# Patient Record
Sex: Female | Born: 1955 | Race: White | Hispanic: No | Marital: Married | Smoking: Never smoker
Health system: Southern US, Community
[De-identification: ages and names within clinical notes are randomized; demographics above are authoritative.]

## PROBLEM LIST (undated history)

## (undated) DIAGNOSIS — I491 Atrial premature depolarization: Secondary | ICD-10-CM

## (undated) DIAGNOSIS — I428 Other cardiomyopathies: Secondary | ICD-10-CM

## (undated) DIAGNOSIS — S52509A Unspecified fracture of the lower end of unspecified radius, initial encounter for closed fracture: Secondary | ICD-10-CM

## (undated) DIAGNOSIS — E559 Vitamin D deficiency, unspecified: Secondary | ICD-10-CM

## (undated) DIAGNOSIS — R9431 Abnormal electrocardiogram [ECG] [EKG]: Secondary | ICD-10-CM

## (undated) DIAGNOSIS — I493 Ventricular premature depolarization: Secondary | ICD-10-CM

## (undated) DIAGNOSIS — M7500 Adhesive capsulitis of unspecified shoulder: Secondary | ICD-10-CM

## (undated) DIAGNOSIS — T8859XA Other complications of anesthesia, initial encounter: Secondary | ICD-10-CM

## (undated) DIAGNOSIS — T4145XA Adverse effect of unspecified anesthetic, initial encounter: Secondary | ICD-10-CM

## (undated) HISTORY — DX: Vitamin D deficiency, unspecified: E55.9

## (undated) HISTORY — DX: Abnormal electrocardiogram (ECG) (EKG): R94.31

## (undated) HISTORY — DX: Other cardiomyopathies: I42.8

## (undated) HISTORY — DX: Ventricular premature depolarization: I49.3

## (undated) HISTORY — DX: Atrial premature depolarization: I49.1

## (undated) HISTORY — PX: COLONOSCOPY: SHX5424

## (undated) HISTORY — PX: TUBAL LIGATION: SHX77

## (undated) HISTORY — DX: Unspecified fracture of the lower end of unspecified radius, initial encounter for closed fracture: S52.509A

---

## 1999-07-14 ENCOUNTER — Other Ambulatory Visit: Admission: RE | Admit: 1999-07-14 | Discharge: 1999-07-14 | Payer: Self-pay | Admitting: *Deleted

## 2000-07-07 ENCOUNTER — Encounter: Admission: RE | Admit: 2000-07-07 | Discharge: 2000-07-07 | Payer: Self-pay | Admitting: *Deleted

## 2000-07-07 ENCOUNTER — Encounter: Payer: Self-pay | Admitting: *Deleted

## 2000-08-03 ENCOUNTER — Other Ambulatory Visit: Admission: RE | Admit: 2000-08-03 | Discharge: 2000-08-03 | Payer: Self-pay | Admitting: *Deleted

## 2001-08-11 ENCOUNTER — Other Ambulatory Visit: Admission: RE | Admit: 2001-08-11 | Discharge: 2001-08-11 | Payer: Self-pay | Admitting: *Deleted

## 2003-06-01 ENCOUNTER — Encounter: Admission: RE | Admit: 2003-06-01 | Discharge: 2003-06-01 | Payer: Self-pay | Admitting: Obstetrics and Gynecology

## 2003-07-16 ENCOUNTER — Other Ambulatory Visit: Admission: RE | Admit: 2003-07-16 | Discharge: 2003-07-16 | Payer: Self-pay | Admitting: Obstetrics and Gynecology

## 2004-07-25 ENCOUNTER — Encounter: Admission: RE | Admit: 2004-07-25 | Discharge: 2004-07-25 | Payer: Self-pay | Admitting: Obstetrics and Gynecology

## 2005-06-12 ENCOUNTER — Ambulatory Visit: Payer: Self-pay | Admitting: Internal Medicine

## 2005-06-23 ENCOUNTER — Ambulatory Visit: Payer: Self-pay | Admitting: Internal Medicine

## 2005-07-16 ENCOUNTER — Ambulatory Visit: Payer: Self-pay | Admitting: Internal Medicine

## 2005-08-24 ENCOUNTER — Ambulatory Visit: Payer: Self-pay | Admitting: Internal Medicine

## 2006-02-02 ENCOUNTER — Encounter: Admission: RE | Admit: 2006-02-02 | Discharge: 2006-02-02 | Payer: Self-pay | Admitting: Obstetrics and Gynecology

## 2007-02-01 ENCOUNTER — Ambulatory Visit: Payer: Self-pay | Admitting: Internal Medicine

## 2007-02-01 LAB — CONVERTED CEMR LAB
ALT: 12 units/L (ref 0–35)
AST: 18 units/L (ref 0–37)
Albumin: 3.8 g/dL (ref 3.5–5.2)
Alkaline Phosphatase: 48 units/L (ref 39–117)
Bilirubin Urine: NEGATIVE
Bilirubin, Direct: 0.2 mg/dL (ref 0.0–0.3)
CO2: 31 meq/L (ref 19–32)
Calcium: 9.2 mg/dL (ref 8.4–10.5)
Chloride: 107 meq/L (ref 96–112)
Eosinophils Absolute: 0.1 10*3/uL (ref 0.0–0.6)
GFR calc non Af Amer: 81 mL/min
Glucose, Bld: 86 mg/dL (ref 70–99)
Glucose, Urine, Semiquant: NEGATIVE
HCT: 35 % — ABNORMAL LOW (ref 36.0–46.0)
HDL: 62.8 mg/dL (ref >39.0)
Hemoglobin: 11.6 g/dL — ABNORMAL LOW (ref 12.0–15.0)
Ketones, urine, test strip: NEGATIVE
LDL Cholesterol: 104 mg/dL — ABNORMAL HIGH (ref 0–99)
MCHC: 33.3 g/dL (ref 30.0–36.0)
Neutrophils Relative %: 54.2 % (ref 43.0–77.0)
Nitrite: NEGATIVE
Platelets: 185 10*3/uL (ref 150–400)
Potassium: 4 meq/L (ref 3.5–5.1)
VLDL: 10 mg/dL (ref 0–40)
WBC Urine, dipstick: NEGATIVE
pH: 7.5

## 2007-02-08 ENCOUNTER — Ambulatory Visit: Payer: Self-pay | Admitting: Internal Medicine

## 2007-02-18 ENCOUNTER — Encounter: Admission: RE | Admit: 2007-02-18 | Discharge: 2007-02-18 | Payer: Self-pay | Admitting: Family Medicine

## 2008-02-20 ENCOUNTER — Encounter: Admission: RE | Admit: 2008-02-20 | Discharge: 2008-02-20 | Payer: Self-pay | Admitting: Family Medicine

## 2008-03-27 ENCOUNTER — Encounter: Admission: RE | Admit: 2008-03-27 | Discharge: 2008-03-27 | Payer: Self-pay | Admitting: Family Medicine

## 2008-10-01 LAB — HM COLONOSCOPY

## 2009-02-26 ENCOUNTER — Encounter: Admission: RE | Admit: 2009-02-26 | Discharge: 2009-02-26 | Payer: Self-pay | Admitting: Family Medicine

## 2010-03-31 ENCOUNTER — Encounter: Admission: RE | Admit: 2010-03-31 | Discharge: 2010-03-31 | Payer: Self-pay | Admitting: Family Medicine

## 2010-03-31 LAB — HM DEXA SCAN: HM Dexa Scan: NORMAL

## 2010-04-29 LAB — HM PAP SMEAR: HM PAP: NEGATIVE

## 2011-04-07 ENCOUNTER — Other Ambulatory Visit: Payer: Self-pay | Admitting: Family Medicine

## 2011-04-07 DIAGNOSIS — Z1231 Encounter for screening mammogram for malignant neoplasm of breast: Secondary | ICD-10-CM

## 2011-05-22 ENCOUNTER — Ambulatory Visit
Admission: RE | Admit: 2011-05-22 | Discharge: 2011-05-22 | Disposition: A | Discharge status: Home / Self Care | Payer: 59 | Source: Ambulatory Visit | Attending: Family Medicine | Admitting: Family Medicine

## 2011-05-22 DIAGNOSIS — Z1231 Encounter for screening mammogram for malignant neoplasm of breast: Secondary | ICD-10-CM

## 2012-02-26 ENCOUNTER — Other Ambulatory Visit: Payer: Self-pay | Admitting: Family Medicine

## 2012-02-26 DIAGNOSIS — Z1231 Encounter for screening mammogram for malignant neoplasm of breast: Secondary | ICD-10-CM

## 2012-05-24 ENCOUNTER — Ambulatory Visit
Admission: RE | Admit: 2012-05-24 | Discharge: 2012-05-24 | Disposition: A | Discharge status: Home / Self Care | Payer: BC Managed Care – PPO | Source: Ambulatory Visit | Attending: Family Medicine | Admitting: Family Medicine

## 2012-05-24 DIAGNOSIS — Z1231 Encounter for screening mammogram for malignant neoplasm of breast: Secondary | ICD-10-CM

## 2013-05-18 DIAGNOSIS — M7500 Adhesive capsulitis of unspecified shoulder: Secondary | ICD-10-CM

## 2013-05-18 HISTORY — DX: Adhesive capsulitis of unspecified shoulder: M75.00

## 2013-05-31 ENCOUNTER — Other Ambulatory Visit: Payer: Self-pay

## 2013-05-31 DIAGNOSIS — Z1231 Encounter for screening mammogram for malignant neoplasm of breast: Secondary | ICD-10-CM

## 2013-09-04 ENCOUNTER — Ambulatory Visit: Payer: BC Managed Care – PPO

## 2013-09-06 ENCOUNTER — Ambulatory Visit
Admission: RE | Admit: 2013-09-06 | Discharge: 2013-09-06 | Disposition: A | Discharge status: Home / Self Care | Payer: BC Managed Care – PPO | Source: Ambulatory Visit

## 2013-09-06 DIAGNOSIS — Z1231 Encounter for screening mammogram for malignant neoplasm of breast: Secondary | ICD-10-CM

## 2013-09-21 ENCOUNTER — Ambulatory Visit (INDEPENDENT_AMBULATORY_CARE_PROVIDER_SITE_OTHER): Payer: BC Managed Care – PPO | Admitting: Cardiology

## 2013-09-21 ENCOUNTER — Encounter: Payer: Self-pay | Admitting: Cardiology

## 2013-09-21 ENCOUNTER — Encounter: Payer: Self-pay | Admitting: General Surgery

## 2013-09-21 VITALS — BP 114/63 | HR 63 | Ht 62.0 in | Wt 135.0 lb

## 2013-09-21 DIAGNOSIS — I4510 Unspecified right bundle-branch block: Secondary | ICD-10-CM

## 2013-09-21 DIAGNOSIS — E559 Vitamin D deficiency, unspecified: Secondary | ICD-10-CM

## 2013-09-21 DIAGNOSIS — R9431 Abnormal electrocardiogram [ECG] [EKG]: Secondary | ICD-10-CM

## 2013-09-21 DIAGNOSIS — R079 Chest pain, unspecified: Secondary | ICD-10-CM

## 2013-09-21 DIAGNOSIS — R011 Cardiac murmur, unspecified: Secondary | ICD-10-CM

## 2013-09-21 NOTE — Progress Notes (Signed)
  [address] [place], [place]  [address] Phone: [phone] Fax:  [phone]  Date:  09/21/2013   ID:  Emma Kim, DOB 01/10/1956, MRN [id number]  PCP:  Herb Grays, MD  Cardiologist:  Armanda Magic, MD     History of Present Illness: Emma Kim is a 58 y.o. female with a no history of CAD who presents today for evaluation of chest pain and new heart murmur.  She says that since December she has had some chest discomfort especially with driving that would radiate down her arm.  She is an Airline pilot and subsequently was diagnosed with a frozen shoulder and is followed by Dr. Rennis Chris and since then her symptoms have improved.  She says that she has not noticed the CP recently and also has some anxiety and was not sure that it wasn't all anxiety.  She denies any DOE, SOB, palpitations, LE edema, dizziness or syncope.   Wt Readings from Last 3 Encounters:  09/21/13 135 lb (61.236 kg)  02/08/07 130 lb (58.968 kg)     Past Medical History  Diagnosis Date  . Vitamin D deficiency   . Heart murmur, systolic   . Abnormal EKG     Current Outpatient Prescriptions  Medication Sig Dispense Refill  . Calcium Carbonate-Vitamin D (CALCIUM + D PO) Take by mouth. 1 tab every couple days       No current facility-administered medications for this visit.    Allergies:   No Known Allergies  Social History:  The patient  reports that she has never smoked. She does not have any smokeless tobacco history on file. She reports that she drinks alcohol. She reports that she does not use illicit drugs.   Family History:  The patient's family history is not on file.   ROS:  Please see the history of present illness.      All other systems reviewed and negative.   PHYSICAL EXAM: VS:  BP 114/63  Pulse 63  Ht 5\' 2"  (1.575 m)  Wt 135 lb (61.236 kg)  BMI 24.69 kg/m2 Well nourished, well developed, in no acute distress HEENT: normal Neck: no JVD Cardiac:  normal S1, S2; RRR; 1/6  SM at RUSB Lungs:  clear to auscultation bilaterally, no wheezing, rhonchi or rales Abd: soft, nontender, no hepatomegaly Ext: no edema Skin: warm and dry Neuro:  CNs 2-12 intact, no focal abnormalities noted  EKG:  NSR with IRBBB with T wave inversions in V1 and V2     ASSESSMENT AND PLAN:  1. Atypical left arm and chest pain that now has resolved after treatment of a "frozen shoulder" with PT.  Her symptoms dont sound cardiac and have since resolved with physical therapy.  No further cardiac workup at this time unless her symptoms reoccur.  2. Heart murmur - 2D echo to assess 3.  IRBBB  Followup with me PRN  Signed, Armanda Magic, MD 09/21/2013 1:33 PM

## 2013-09-21 NOTE — Patient Instructions (Signed)
Your physician recommends that you continue on your current medications as directed. Please refer to the Current Medication list given to you today.  Your physician has requested that you have an echocardiogram. Echocardiography is a painless test that uses sound waves to create images of your heart. It provides your doctor with information about the size and shape of your heart and how well your heart's chambers and valves are working. This procedure takes approximately one hour. There are no restrictions for this procedure.  Your physician recommends that you schedule a follow-up appointment As Needed

## 2013-10-04 ENCOUNTER — Ambulatory Visit (HOSPITAL_COMMUNITY): Payer: BC Managed Care – PPO | Attending: Cardiology | Admitting: Radiology

## 2013-10-04 DIAGNOSIS — R011 Cardiac murmur, unspecified: Secondary | ICD-10-CM

## 2013-10-04 NOTE — Progress Notes (Signed)
Echocardiogram performed.  

## 2013-10-06 ENCOUNTER — Other Ambulatory Visit: Payer: Self-pay | Admitting: *Deleted

## 2013-10-06 DIAGNOSIS — R079 Chest pain, unspecified: Secondary | ICD-10-CM

## 2013-10-10 ENCOUNTER — Other Ambulatory Visit (INDEPENDENT_AMBULATORY_CARE_PROVIDER_SITE_OTHER): Payer: BC Managed Care – PPO

## 2013-10-10 DIAGNOSIS — R079 Chest pain, unspecified: Secondary | ICD-10-CM

## 2013-10-10 LAB — BASIC METABOLIC PANEL
BUN: 15 mg/dL (ref 6–23)
CALCIUM: 9.4 mg/dL (ref 8.4–10.5)
CO2: 30 meq/L (ref 19–32)
CREATININE: 0.8 mg/dL (ref 0.4–1.2)
Chloride: 105 mEq/L (ref 96–112)
GFR: 75.45 mL/min (ref >60.00)
Glucose, Bld: 79 mg/dL (ref 70–99)
Potassium: 3.4 mEq/L — ABNORMAL LOW (ref 3.5–5.1)
SODIUM: 142 meq/L (ref 135–145)

## 2013-10-11 ENCOUNTER — Encounter: Payer: Self-pay | Admitting: Cardiology

## 2013-10-23 ENCOUNTER — Ambulatory Visit (HOSPITAL_COMMUNITY)
Admission: RE | Admit: 2013-10-23 | Discharge: 2013-10-23 | Disposition: A | Discharge status: Home / Self Care | Payer: BC Managed Care – PPO | Source: Ambulatory Visit | Attending: Cardiology | Admitting: Cardiology

## 2013-10-23 DIAGNOSIS — R079 Chest pain, unspecified: Secondary | ICD-10-CM | POA: Insufficient documentation

## 2013-10-23 MED ORDER — GADOBENATE DIMEGLUMINE 529 MG/ML IV SOLN
20.0000 mL | Freq: Once | INTRAVENOUS | Status: AC
  Administered 2013-10-23: 20 mL via INTRAVENOUS

## 2013-10-26 ENCOUNTER — Telehealth: Payer: Self-pay | Admitting: Cardiology

## 2013-10-26 DIAGNOSIS — I428 Other cardiomyopathies: Secondary | ICD-10-CM

## 2013-10-26 NOTE — Telephone Encounter (Signed)
1 yr f/u echo scheduled.

## 2014-06-12 ENCOUNTER — Encounter (HOSPITAL_COMMUNITY): Payer: Self-pay | Admitting: *Deleted

## 2014-06-12 NOTE — Progress Notes (Signed)
Emma Kim had a cardiac work up last year due to a heart murmer and chest pain.  Chest pain had subsided after treated for frozen shoulder.  Emma Kim denies any further chest pain.

## 2014-06-13 ENCOUNTER — Ambulatory Visit (HOSPITAL_COMMUNITY)
Admission: RE | Admit: 2014-06-13 | Discharge: 2014-06-14 | Disposition: A | Discharge status: Home / Self Care | Payer: BLUE CROSS/BLUE SHIELD | Source: Ambulatory Visit | Attending: Orthopedic Surgery | Admitting: Orthopedic Surgery

## 2014-06-13 ENCOUNTER — Encounter (HOSPITAL_COMMUNITY): Payer: Self-pay | Admitting: Certified Registered Nurse Anesthetist

## 2014-06-13 ENCOUNTER — Ambulatory Visit (HOSPITAL_COMMUNITY): Payer: BLUE CROSS/BLUE SHIELD | Admitting: Certified Registered Nurse Anesthetist

## 2014-06-13 ENCOUNTER — Encounter (HOSPITAL_COMMUNITY)
Admission: RE | Disposition: A | Discharge status: Home / Self Care | Payer: Self-pay | Source: Ambulatory Visit | Attending: Orthopedic Surgery

## 2014-06-13 DIAGNOSIS — W19XXXA Unspecified fall, initial encounter: Secondary | ICD-10-CM | POA: Insufficient documentation

## 2014-06-13 DIAGNOSIS — Y929 Unspecified place or not applicable: Secondary | ICD-10-CM | POA: Insufficient documentation

## 2014-06-13 DIAGNOSIS — S52501D Unspecified fracture of the lower end of right radius, subsequent encounter for closed fracture with routine healing: Secondary | ICD-10-CM

## 2014-06-13 DIAGNOSIS — S52509A Unspecified fracture of the lower end of unspecified radius, initial encounter for closed fracture: Secondary | ICD-10-CM

## 2014-06-13 DIAGNOSIS — S52501A Unspecified fracture of the lower end of right radius, initial encounter for closed fracture: Secondary | ICD-10-CM | POA: Insufficient documentation

## 2014-06-13 DIAGNOSIS — E559 Vitamin D deficiency, unspecified: Secondary | ICD-10-CM | POA: Insufficient documentation

## 2014-06-13 HISTORY — DX: Unspecified fracture of the lower end of unspecified radius, initial encounter for closed fracture: S52.509A

## 2014-06-13 HISTORY — DX: Adhesive capsulitis of unspecified shoulder: M75.00

## 2014-06-13 HISTORY — PX: OPEN REDUCTION INTERNAL FIXATION (ORIF) DISTAL RADIAL FRACTURE: SHX5989

## 2014-06-13 HISTORY — DX: Adverse effect of unspecified anesthetic, initial encounter: T41.45XA

## 2014-06-13 HISTORY — DX: Other complications of anesthesia, initial encounter: T88.59XA

## 2014-06-13 LAB — BASIC METABOLIC PANEL
ANION GAP: 7 (ref 5–15)
BUN: 12 mg/dL (ref 6–23)
CALCIUM: 9.5 mg/dL (ref 8.4–10.5)
CO2: 29 mmol/L (ref 19–32)
Chloride: 105 mmol/L (ref 96–112)
Creatinine, Ser: 0.84 mg/dL (ref 0.50–1.10)
GFR calc Af Amer: 88 mL/min — ABNORMAL LOW (ref >90)
GFR calc non Af Amer: 76 mL/min — ABNORMAL LOW (ref >90)
Glucose, Bld: 104 mg/dL — ABNORMAL HIGH (ref 70–99)
Potassium: 3.5 mmol/L (ref 3.5–5.1)
SODIUM: 141 mmol/L (ref 135–145)

## 2014-06-13 LAB — CBC
HCT: 38.7 % (ref 36.0–46.0)
HEMOGLOBIN: 12.8 g/dL (ref 12.0–15.0)
MCH: 28.4 pg (ref 26.0–34.0)
MCHC: 33.1 g/dL (ref 30.0–36.0)
MCV: 85.8 fL (ref 78.0–100.0)
Platelets: 192 10*3/uL (ref 150–400)
RBC: 4.51 MIL/uL (ref 3.87–5.11)
RDW: 12.6 % (ref 11.5–15.5)
WBC: 6.3 10*3/uL (ref 4.0–10.5)

## 2014-06-13 SURGERY — OPEN REDUCTION INTERNAL FIXATION (ORIF) DISTAL RADIUS FRACTURE
Anesthesia: Regional | Site: Arm Lower | Laterality: Right

## 2014-06-13 MED ORDER — MORPHINE SULFATE 2 MG/ML IJ SOLN: 1.0000 mg | INTRAMUSCULAR | Status: DC | PRN

## 2014-06-13 MED ORDER — CEFAZOLIN SODIUM-DEXTROSE 2-3 GM-% IV SOLR
INTRAVENOUS | Status: AC
  Filled 2014-06-13: qty 50

## 2014-06-13 MED ORDER — EPHEDRINE SULFATE 50 MG/ML IJ SOLN
INTRAMUSCULAR | Status: DC | PRN
  Administered 2014-06-13: 10 mg via INTRAVENOUS
  Administered 2014-06-13: 5 mg via INTRAVENOUS

## 2014-06-13 MED ORDER — ONDANSETRON HCL 4 MG PO TABS: 4.0000 mg | ORAL_TABLET | Freq: Four times a day (QID) | ORAL | Status: DC | PRN

## 2014-06-13 MED ORDER — CEFAZOLIN SODIUM 1-5 GM-% IV SOLN
1.0000 g | Freq: Three times a day (TID) | INTRAVENOUS | Status: DC
  Administered 2014-06-14: 1 g via INTRAVENOUS
  Filled 2014-06-13 (×5): qty 50

## 2014-06-13 MED ORDER — DEXAMETHASONE SODIUM PHOSPHATE 4 MG/ML IJ SOLN
INTRAMUSCULAR | Status: DC | PRN
  Administered 2014-06-13: 4 mg via INTRAVENOUS

## 2014-06-13 MED ORDER — PROPOFOL 10 MG/ML IV BOLUS
INTRAVENOUS | Status: DC | PRN
  Administered 2014-06-13: 130 mg via INTRAVENOUS

## 2014-06-13 MED ORDER — DOCUSATE SODIUM 100 MG PO CAPS
100.0000 mg | ORAL_CAPSULE | Freq: Two times a day (BID) | ORAL | Status: DC
  Administered 2014-06-13 – 2014-06-14 (×2): 100 mg via ORAL
  Filled 2014-06-13 (×3): qty 1

## 2014-06-13 MED ORDER — LACTATED RINGERS IV SOLN
INTRAVENOUS | Status: DC | PRN
  Administered 2014-06-13 (×2): via INTRAVENOUS

## 2014-06-13 MED ORDER — HYDROMORPHONE HCL 1 MG/ML IJ SOLN: 0.2500 mg | INTRAMUSCULAR | Status: DC | PRN

## 2014-06-13 MED ORDER — ONDANSETRON HCL 4 MG/2ML IJ SOLN: 4.0000 mg | Freq: Four times a day (QID) | INTRAMUSCULAR | Status: DC | PRN

## 2014-06-13 MED ORDER — CHLORHEXIDINE GLUCONATE 4 % EX LIQD
60.0000 mL | Freq: Once | CUTANEOUS | Status: DC
  Filled 2014-06-13: qty 60

## 2014-06-13 MED ORDER — OXYCODONE-ACETAMINOPHEN 5-325 MG PO TABS: 1.0000 | ORAL_TABLET | ORAL | Status: DC | PRN

## 2014-06-13 MED ORDER — DOCUSATE SODIUM 100 MG PO CAPS: 100.0000 mg | ORAL_CAPSULE | Freq: Two times a day (BID) | ORAL | Status: DC

## 2014-06-13 MED ORDER — HYDROCODONE-ACETAMINOPHEN 5-325 MG PO TABS: 1.0000 | ORAL_TABLET | ORAL | Status: DC | PRN

## 2014-06-13 MED ORDER — CEFAZOLIN SODIUM 1-5 GM-% IV SOLN
1.0000 g | INTRAVENOUS | Status: AC
  Administered 2014-06-13: 1 g via INTRAVENOUS
  Filled 2014-06-13: qty 50

## 2014-06-13 MED ORDER — DIPHENHYDRAMINE HCL 25 MG PO CAPS: 25.0000 mg | ORAL_CAPSULE | Freq: Four times a day (QID) | ORAL | Status: DC | PRN

## 2014-06-13 MED ORDER — METHOCARBAMOL 1000 MG/10ML IJ SOLN
500.0000 mg | Freq: Four times a day (QID) | INTRAMUSCULAR | Status: DC | PRN
  Filled 2014-06-13: qty 5

## 2014-06-13 MED ORDER — LACTATED RINGERS IV SOLN
INTRAVENOUS | Status: DC
  Administered 2014-06-13: 50 mL/h via INTRAVENOUS

## 2014-06-13 MED ORDER — VITAMIN C 500 MG PO TABS
500.0000 mg | ORAL_TABLET | Freq: Every day | ORAL | Status: DC
Start: 2014-06-13 — End: 2014-11-26

## 2014-06-13 MED ORDER — FENTANYL CITRATE 0.05 MG/ML IJ SOLN
INTRAMUSCULAR | Status: AC
  Filled 2014-06-13: qty 2

## 2014-06-13 MED ORDER — OXYCODONE HCL 5 MG PO TABS: 5.0000 mg | ORAL_TABLET | Freq: Once | ORAL | Status: DC | PRN

## 2014-06-13 MED ORDER — CEFAZOLIN SODIUM-DEXTROSE 2-3 GM-% IV SOLR
2.0000 g | INTRAVENOUS | Status: AC
  Administered 2014-06-13: 2 g via INTRAVENOUS

## 2014-06-13 MED ORDER — FENTANYL CITRATE 0.05 MG/ML IJ SOLN
INTRAMUSCULAR | Status: DC | PRN
  Administered 2014-06-13: 50 ug via INTRAVENOUS

## 2014-06-13 MED ORDER — OXYCODONE HCL 5 MG/5ML PO SOLN: 5.0000 mg | Freq: Once | ORAL | Status: DC | PRN

## 2014-06-13 MED ORDER — METHOCARBAMOL 500 MG PO TABS
500.0000 mg | ORAL_TABLET | Freq: Four times a day (QID) | ORAL | Status: DC | PRN
  Filled 2014-06-13: qty 1

## 2014-06-13 MED ORDER — PROPOFOL 10 MG/ML IV BOLUS
INTRAVENOUS | Status: AC
  Filled 2014-06-13: qty 20

## 2014-06-13 MED ORDER — MIDAZOLAM HCL 2 MG/2ML IJ SOLN
INTRAMUSCULAR | Status: AC
  Filled 2014-06-13: qty 2

## 2014-06-13 MED ORDER — BUPIVACAINE HCL (PF) 0.25 % IJ SOLN
INTRAMUSCULAR | Status: AC
  Filled 2014-06-13: qty 30

## 2014-06-13 MED ORDER — ONDANSETRON HCL 4 MG/2ML IJ SOLN
INTRAMUSCULAR | Status: DC | PRN
  Administered 2014-06-13: 4 mg via INTRAVENOUS

## 2014-06-13 MED ORDER — METHOCARBAMOL 500 MG PO TABS: 500.0000 mg | ORAL_TABLET | Freq: Four times a day (QID) | ORAL | Status: DC

## 2014-06-13 MED ORDER — PROMETHAZINE HCL 25 MG/ML IJ SOLN: 6.2500 mg | INTRAMUSCULAR | Status: DC | PRN

## 2014-06-13 MED ORDER — KETOROLAC TROMETHAMINE 30 MG/ML IJ SOLN: 30.0000 mg | Freq: Once | INTRAMUSCULAR | Status: DC | PRN

## 2014-06-13 MED ORDER — ONDANSETRON HCL 4 MG/2ML IJ SOLN
INTRAMUSCULAR | Status: AC
  Filled 2014-06-13: qty 2

## 2014-06-13 MED ORDER — FENTANYL CITRATE 0.05 MG/ML IJ SOLN
INTRAMUSCULAR | Status: AC
  Filled 2014-06-13: qty 5

## 2014-06-13 MED ORDER — KCL IN DEXTROSE-NACL 20-5-0.45 MEQ/L-%-% IV SOLN
INTRAVENOUS | Status: DC
  Administered 2014-06-13: 50 mL/h via INTRAVENOUS
  Filled 2014-06-13 (×2): qty 1000

## 2014-06-13 MED ORDER — VITAMIN C 500 MG PO TABS
1000.0000 mg | ORAL_TABLET | Freq: Every day | ORAL | Status: DC
  Administered 2014-06-13 – 2014-06-14 (×2): 1000 mg via ORAL
  Filled 2014-06-13 (×2): qty 2

## 2014-06-13 MED ORDER — BUPIVACAINE-EPINEPHRINE (PF) 0.5% -1:200000 IJ SOLN
INTRAMUSCULAR | Status: DC | PRN
  Administered 2014-06-13: 30 mL via PERINEURAL

## 2014-06-13 MED ORDER — ARTIFICIAL TEARS OP OINT
TOPICAL_OINTMENT | OPHTHALMIC | Status: AC
  Filled 2014-06-13: qty 3.5

## 2014-06-13 MED ORDER — 0.9 % SODIUM CHLORIDE (POUR BTL) OPTIME
TOPICAL | Status: DC | PRN
  Administered 2014-06-13: 1000 mL

## 2014-06-13 SURGICAL SUPPLY — 65 items
BANDAGE ELASTIC 3 VELCRO ST LF (GAUZE/BANDAGES/DRESSINGS) ×2 IMPLANT
BANDAGE ELASTIC 4 VELCRO ST LF (GAUZE/BANDAGES/DRESSINGS) ×2 IMPLANT
BIT DRILL 2.2 SS TIBIAL (BIT) ×2 IMPLANT
BLADE SURG ROTATE 9660 (MISCELLANEOUS) IMPLANT
BNDG ESMARK 4X9 LF (GAUZE/BANDAGES/DRESSINGS) ×2 IMPLANT
BNDG GAUZE ELAST 4 BULKY (GAUZE/BANDAGES/DRESSINGS) ×2 IMPLANT
CANISTER SUCTION 2500CC (MISCELLANEOUS) ×2 IMPLANT
CORDS BIPOLAR (ELECTRODE) ×2 IMPLANT
COVER SURGICAL LIGHT HANDLE (MISCELLANEOUS) ×2 IMPLANT
CUFF TOURNIQUET SINGLE 18IN (TOURNIQUET CUFF) ×2 IMPLANT
CUFF TOURNIQUET SINGLE 24IN (TOURNIQUET CUFF) IMPLANT
DRAIN TLS ROUND 10FR (DRAIN) IMPLANT
DRAPE OEC MINIVIEW 54X84 (DRAPES) ×2 IMPLANT
DRAPE SURG 17X11 SM STRL (DRAPES) ×2 IMPLANT
DRSG ADAPTIC 3X8 NADH LF (GAUZE/BANDAGES/DRESSINGS) ×2 IMPLANT
ELECT REM PT RETURN 9FT ADLT (ELECTROSURGICAL)
ELECTRODE REM PT RTRN 9FT ADLT (ELECTROSURGICAL) IMPLANT
GAUZE SPONGE 4X4 12PLY STRL (GAUZE/BANDAGES/DRESSINGS) ×2 IMPLANT
GAUZE SPONGE 4X4 16PLY XRAY LF (GAUZE/BANDAGES/DRESSINGS) IMPLANT
GLOVE BIOGEL PI IND STRL 7.0 (GLOVE) ×2 IMPLANT
GLOVE BIOGEL PI IND STRL 8.5 (GLOVE) ×1 IMPLANT
GLOVE BIOGEL PI INDICATOR 7.0 (GLOVE) ×2
GLOVE BIOGEL PI INDICATOR 8.5 (GLOVE) ×1
GLOVE ECLIPSE 6.5 STRL STRAW (GLOVE) ×2 IMPLANT
GLOVE SURG ORTHO 8.0 STRL STRW (GLOVE) ×2 IMPLANT
GOWN STRL REUS W/ TWL LRG LVL3 (GOWN DISPOSABLE) ×2 IMPLANT
GOWN STRL REUS W/ TWL XL LVL3 (GOWN DISPOSABLE) ×1 IMPLANT
GOWN STRL REUS W/TWL LRG LVL3 (GOWN DISPOSABLE) ×2
GOWN STRL REUS W/TWL XL LVL3 (GOWN DISPOSABLE) ×1
K-WIRE 1.6 (WIRE) ×1
K-WIRE FX5X1.6XNS BN SS (WIRE) ×1
KIT BASIN OR (CUSTOM PROCEDURE TRAY) ×2 IMPLANT
KIT ROOM TURNOVER OR (KITS) ×2 IMPLANT
KWIRE FX5X1.6XNS BN SS (WIRE) ×1 IMPLANT
MANIFOLD NEPTUNE II (INSTRUMENTS) ×2 IMPLANT
NEEDLE HYPO 25X1 1.5 SAFETY (NEEDLE) IMPLANT
NS IRRIG 1000ML POUR BTL (IV SOLUTION) ×2 IMPLANT
PACK ORTHO EXTREMITY (CUSTOM PROCEDURE TRAY) ×2 IMPLANT
PAD ARMBOARD 7.5X6 YLW CONV (MISCELLANEOUS) ×4 IMPLANT
PAD CAST 4YDX4 CTTN HI CHSV (CAST SUPPLIES) ×1 IMPLANT
PADDING CAST COTTON 4X4 STRL (CAST SUPPLIES) ×1
PEG LOCKING SMOOTH 2.2X18 (Peg) ×6 IMPLANT
PEG LOCKING SMOOTH 2.2X20 (Screw) ×2 IMPLANT
PLATE NARROW DVR RIGHT (Plate) ×2 IMPLANT
SCREW LOCK 12X2.7X 3 LD (Screw) ×1 IMPLANT
SCREW LOCK 14X2.7X 3 LD TPR (Screw) ×4 IMPLANT
SCREW LOCK 18X2.7X 3 LD TPR (Screw) ×1 IMPLANT
SCREW LOCKING 2.7X12MM (Screw) ×1 IMPLANT
SCREW LOCKING 2.7X14 (Screw) ×4 IMPLANT
SCREW LOCKING 2.7X18 (Screw) ×1 IMPLANT
SCREW MULTI DIRECTIONAL 2.7X18 (Screw) ×2 IMPLANT
SOAP 2 % CHG 4 OZ (WOUND CARE) ×2 IMPLANT
SPONGE LAP 4X18 X RAY DECT (DISPOSABLE) IMPLANT
STRIP CLOSURE SKIN 1/2X4 (GAUZE/BANDAGES/DRESSINGS) IMPLANT
SUT ETHILON 4 0 PS 2 18 (SUTURE) IMPLANT
SUT MNCRL AB 4-0 PS2 18 (SUTURE) IMPLANT
SUT PROLENE 4 0 PS 2 18 (SUTURE) ×4 IMPLANT
SUT VIC AB 2-0 FS1 27 (SUTURE) ×2 IMPLANT
SUT VICRYL 4-0 PS2 18IN ABS (SUTURE) ×2 IMPLANT
SYR CONTROL 10ML LL (SYRINGE) IMPLANT
SYSTEM CHEST DRAIN TLS 7FR (DRAIN) IMPLANT
TOWEL OR 17X24 6PK STRL BLUE (TOWEL DISPOSABLE) ×2 IMPLANT
TOWEL OR 17X26 10 PK STRL BLUE (TOWEL DISPOSABLE) ×2 IMPLANT
TUBE CONNECTING 12X1/4 (SUCTIONS) ×2 IMPLANT
YANKAUER SUCT BULB TIP NO VENT (SUCTIONS) ×2 IMPLANT

## 2014-06-13 NOTE — Anesthesia Postprocedure Evaluation (Signed)
Anesthesia Post Note  Patient: Emma Kim  Procedure(s) Performed: Procedure(s) (LRB): OPEN REDUCTION INTERNAL FIXATION (ORIF) RIGHT DISTAL RADIAL FRACTURE (Right)  Anesthesia type: general  Patient location: PACU  Post pain: Pain level controlled  Post assessment: Patient's Cardiovascular Status Stable  Last Vitals:  Filed Vitals:   06/13/14 2021  BP: 132/79  Pulse: 66  Temp: 36.6 C  Resp:     Post vital signs: Reviewed and stable  Level of consciousness: sedated  Complications: No apparent anesthesia complications

## 2014-06-13 NOTE — Transfer of Care (Signed)
Immediate Anesthesia Transfer of Care Note  Patient: Emma Kim  Procedure(s) Performed: Procedure(s): OPEN REDUCTION INTERNAL FIXATION (ORIF) RIGHT DISTAL RADIAL FRACTURE (Right)  Patient Location: PACU  Anesthesia Type:General and Regional  Level of Consciousness: awake, alert , oriented and patient cooperative  Airway & Oxygen Therapy: Patient Spontanous Breathing and Patient connected to nasal cannula oxygen  Post-op Assessment: Report given to PACU RN, Post -op Vital signs reviewed and stable and Patient moving all extremities  Post vital signs: Reviewed and stable  Last Vitals:  Filed Vitals:   06/13/14 1443  BP: 170/97  Pulse: 64  Temp: 37.2 C  Resp: 16    Complications: No apparent anesthesia complications

## 2014-06-13 NOTE — Progress Notes (Signed)
Orthopedic Tech Progress Note Patient Details:  Emma Kim 11-06-1955 [id number] Delivered arm sling to pt.'s nurse. Ortho Devices Type of Ortho Device: Arm sling Ortho Device/Splint Location: RUE Ortho Device/Splint Interventions: Other (comment)   [name] 06/13/2014, 7:36 PM

## 2014-06-13 NOTE — Anesthesia Preprocedure Evaluation (Addendum)
Anesthesia Evaluation  Patient identified by MRN, date of birth, ID band Patient awake    Reviewed: Allergy & Precautions, NPO status , Patient's Chart, lab work & pertinent test results  History of Anesthesia Complications Negative for: history of anesthetic complications  Airway Mallampati: II  TM Distance: >3 FB Neck ROM: Full    Dental no notable dental hx. (+) Dental Advisory Given   Pulmonary neg pulmonary ROS,  breath sounds clear to auscultation  Pulmonary exam normal       Cardiovascular + Valvular Problems/Murmurs Rhythm:Regular Rate:Normal     Neuro/Psych PSYCHIATRIC DISORDERS Anxiety negative neurological ROS     GI/Hepatic negative GI ROS, Neg liver ROS,   Endo/Other  negative endocrine ROS  Renal/GU negative Renal ROS  negative genitourinary   Musculoskeletal  (+) Arthritis -, Osteoarthritis,    Abdominal   Peds negative pediatric ROS (+)  Hematology negative hematology ROS (+)   Anesthesia Other Findings   Reproductive/Obstetrics negative OB ROS                             Anesthesia Physical Anesthesia Plan  ASA: II  Anesthesia Plan: General and Regional   Post-op Pain Management: MAC Combined w/ Regional for Post-op pain   Induction: Intravenous  Airway Management Planned: LMA  Additional Equipment:   Intra-op Plan:   Post-operative Plan: Extubation in OR  Informed Consent: I have reviewed the patients History and Physical, chart, labs and discussed the procedure including the risks, benefits and alternatives for the proposed anesthesia with the patient or authorized representative who has indicated his/her understanding and acceptance.   Dental advisory given  Plan Discussed with: CRNA  Anesthesia Plan Comments:        Anesthesia Quick Evaluation

## 2014-06-13 NOTE — H&P (Signed)
Emma Kim is an 59 y.o. female.   Chief Complaint: Right wrist injury  HPI: Pt fell and landed and injured right wrist. Pt with deformity and pain. No prior injury to right wrist. Pt here for surgery.  Past Medical History  Diagnosis Date  . Vitamin D deficiency   . Heart murmur, systolic   . Abnormal EKG   . Complication of anesthesia     when she woke up, "felt like she was coming through a fog"  . Frozen shoulder 2015    left    Past Surgical History  Procedure Laterality Date  . Tubal ligation    . Colonoscopy      Family History  Problem Relation Age of Onset  . Hypertension Mother   . Hyperlipidemia Mother   . Hyperlipidemia Father    Social History:  reports that she has never smoked. She does not have any smokeless tobacco history on file. She reports that she drinks about 4.2 oz of alcohol per week. She reports that she does not use illicit drugs.  Allergies: No Known Allergies  Medications Prior to Admission  Medication Sig Dispense Refill  . acetaminophen (TYLENOL) 325 MG tablet Take 650 mg by mouth every 6 (six) hours as needed for headache (pain).       No results found for this or any previous visit (from the past 48 hour(s)). No results found.  ROS no RECENT ILLNESSES OR HOSPITALIZATIONS  There were no vitals taken for this visit. Physical Exam :  General Appearance:  Alert, cooperative, no distress, appears stated age  Head:  Normocephalic, without obvious abnormality, atraumatic  Eyes:  Pupils equal, conjunctiva/corneas clear,         Throat: Lips, mucosa, and tongue normal; teeth and gums normal  Neck: No visible masses     Lungs:   respirations unlabored  Chest Wall:  No tenderness or deformity  Heart:  Regular rate and rhythm,  Abdomen:   Soft, non-tender,         Extremities: RIGHT WRIST: SKIN INTACT, FINGERS WARM WELL PERFUSED GOOD CAP REFIL ABLE TO EXTEND THUMB ABLE TO WIGGLE FINGERS  Pulses: 2+ and symmetric  Skin: Skin  color, texture, turgor normal, no rashes or lesions     Neurologic: Normal    Assessment/Plan RIGHT DISTAL RADIUS INTRAARTICULAR FRACTURE  RIGHT DISTAL RADIUS OPEN REDUCTION AND INTERNAL FIXATION  R/B/A DISCUSSED WITH PT IN OFFICE.  PT VOICED UNDERSTANDING OF PLAN CONSENT SIGNED DAY OF SURGERY PT SEEN AND EXAMINED PRIOR TO OPERATIVE PROCEDURE/DAY OF SURGERY SITE MARKED. QUESTIONS ANSWERED WILL BE ADMITTED OBSERVATION FOLLOWING SURGERY  WE ARE PLANNING SURGERY FOR YOUR UPPER EXTREMITY. THE RISKS AND BENEFITS OF SURGERY INCLUDE BUT NOT LIMITED TO BLEEDING INFECTION, DAMAGE TO NEARBY NERVES ARTERIES TENDONS, FAILURE OF SURGERY TO ACCOMPLISH ITS INTENDED GOALS, PERSISTENT SYMPTOMS AND NEED FOR FURTHER SURGICAL INTERVENTION. WITH THIS IN MIND WE WILL PROCEED. I HAVE DISCUSSED WITH THE PATIENT THE PRE AND POSTOPERATIVE REGIMEN AND THE DOS AND DON'TS. PT VOICED UNDERSTANDING AND INFORMED CONSENT SIGNED.  Sharma Covert 06/13/2014, 1700

## 2014-06-13 NOTE — Discharge Instructions (Signed)
KEEP BANDAGE CLEAN AND DRY °CALL OFFICE FOR F/U APPT [phone] IN 14 DAYS °DR Shaunie Boehm CELL PHONE [phone] °KEEP HAND ELEVATED ABOVE HEART °OK TO APPLY ICE TO OPERATIVE AREA °CONTACT OFFICE IF ANY WORSENING PAIN OR CONCERNS. °

## 2014-06-13 NOTE — Brief Op Note (Signed)
06/13/2014  5:05 PM  PATIENT:  Emma Kim  59 y.o. female  PRE-OPERATIVE DIAGNOSIS:  right distal radius fracture  POST-OPERATIVE DIAGNOSIS:  right distal radius fracture  PROCEDURE:  Procedure(s): OPEN REDUCTION INTERNAL FIXATION (ORIF) RIGHT DISTAL RADIAL FRACTURE (Right)  SURGEON:  Surgeon(s) and Role:    * Sharma Covert, MD - Primary  PHYSICIAN ASSISTANT:   ASSISTANTS: none   ANESTHESIA:   general  EBL:     BLOOD ADMINISTERED:none  DRAINS: none   LOCAL MEDICATIONS USED:  NONE  SPECIMEN:  No Specimen  DISPOSITION OF SPECIMEN:  N/A  COUNTS:  YES  TOURNIQUET:    DICTATION: .Other Dictation: Dictation Number [id number]  PLAN OF CARE: Admit for overnight observation  PATIENT DISPOSITION:  PACU - hemodynamically stable.   Delay start of Pharmacological VTE agent (>24hrs) due to surgical blood loss or risk of bleeding: not applicable

## 2014-06-13 NOTE — Anesthesia Procedure Notes (Addendum)
Anesthesia Regional Block:  Supraclavicular block  Pre-Anesthetic Checklist: ,, timeout performed, Correct Patient, Correct Site, Correct Laterality, Correct Procedure, Correct Position, site marked, Risks and benefits discussed,  Surgical consent,  Pre-op evaluation,  At surgeon's request and post-op pain management  Laterality: Right  Prep: chloraprep       Needles:  Injection technique: Single-shot  Needle Type: Echogenic Stimulator Needle     Needle Length: 9cm 9 cm Needle Gauge: 21 and 21 G    Additional Needles:  Procedures: ultrasound guided (picture in chart) and nerve stimulator Supraclavicular block  Nerve Stimulator or Paresthesia:  Response: median, 0.5 mA,  Response: musculocutaneous, 0.5 mA,   Additional Responses:   Narrative:  Start time: 06/13/2014 4:15 PM End time: 06/13/2014 4:05 PM Injection made incrementally with aspirations every 5 mL.  Events: injection painful  Performed by: Personally  Anesthesiologist: FITZGERALD, ROBERT E  Additional Notes: Risks, benefits and alternative to block explained extensively.  Patient tolerated procedure well, without complications.   Procedure Name: LMA Insertion Date/Time: 06/13/2014 5:32 PM Performed by: Reine Just Pre-anesthesia Checklist: Patient identified, Emergency Drugs available, Suction available, Patient being monitored and Timeout performed Patient Re-evaluated:Patient Re-evaluated prior to inductionOxygen Delivery Method: Circle system utilized and Simple face mask Preoxygenation: Pre-oxygenation with 100% oxygen Intubation Type: IV induction Ventilation: Mask ventilation without difficulty LMA: LMA inserted LMA Size: 4.0 Number of attempts: 1 Airway Equipment and Method: Patient positioned with wedge pillow Placement Confirmation: positive ETCO2 and breath sounds checked- equal and bilateral Tube secured with: Tape Dental Injury: Teeth and Oropharynx as per pre-operative assessment

## 2014-06-14 ENCOUNTER — Encounter (HOSPITAL_COMMUNITY): Payer: Self-pay | Admitting: Orthopedic Surgery

## 2014-06-14 DIAGNOSIS — S52501A Unspecified fracture of the lower end of right radius, initial encounter for closed fracture: Secondary | ICD-10-CM | POA: Diagnosis not present

## 2014-06-14 NOTE — Progress Notes (Signed)
Patient states that she still feels the effects of the general regional block and refuses pain medications at this time. Nursing will continue to monitor.

## 2014-06-14 NOTE — Op Note (Signed)
Emma Kim, Emma Kim NO.:  [id number]  MEDICAL RECORD NO.:  [id number]  LOCATION:  5N06C                        FACILITY:  [facility]  PHYSICIAN:  Madelynn Done, MD  DATE OF BIRTH:  [birth year]  DATE OF PROCEDURE:  06/13/2014 DATE OF DISCHARGE:                              OPERATIVE REPORT   POSTOPERATIVE DIAGNOSIS:  Right distal radius fracture, comminuted intra- articular, 3 or more fragments.  POSTOPERATIVE DIAGNOSIS:  Right distal radius fracture, comminuted intra- articular, 3 or more fragments.  SURGEON:  Madelynn Done, MD who scrubbed and present for the entire procedure.  ASSISTANT SURGEON:  None.  ANESTHESIA:  General via LMA with a supraclavicular block performed by Anesthesia.  SURGICAL PROCEDURE: 1. Open treatment of right wrist intra-articular distal radius     fracture, 3 or more fragments. 2. Right wrist brachioradialis tendon release and tendon tenotomy. 3. Radiographs, 3 views, right wrist.  SURGICAL IMPLANTS:  Biomet DVR Crosslock narrow plate with 6 screws distally and 5 screws proximally.  SURGICAL INDICATIONS:  Emma Kim is a right-hand dominant female who sustained a closed intra-articular distal radius fracture.  The patient was seen and evaluated in the office, and given the nature and the injuries; recommended she undergo the above procedure.  Risks, benefits, and alternatives were discussed in detail with the patient; and signed informed consent was obtained.  Risks include but not limited to bleeding, infection, damage to nearby nerves, arteries, or tendons, loss of motion of wrist and digits, incomplete relief of symptoms, and need for further surgical intervention for nonunion, malunion, hardware failure.  DESCRIPTION OF PROCEDURE:  The patient was properly identified in the preop holding area and marked with a permanent marker made on the right wrist to indicate correct operative site.  The patient was then  brought back to the operating room, placed supine on Anesthesia room table. General anesthesia was administered.  The patient tolerated this well. A well-padded tourniquet was placed on the right brachium and sealed with 1000 drape.  Right upper extremity was then prepped and draped in normal sterile fashion.  Time-out was called.  Correct site was identified, and the procedure then began.  Attention then turned to the right wrist where the limb was then elevated using Esmarch exsanguination and tourniquet insufflated.  Dissection was carried down through the skin, subcutaneous tissue.  The patient received preoperative antibiotics prior to skin incision.  The FCR sheath was then opened proximally and distally.  Going through the floor of the FCR sheath, the FPL was then carefully swept out of the way.  Pronator quadratus was then carefully elevated and the fracture site was then exposed.  This was an intra-articular fracture of 3 or more fragments. We wanted to reduce the radial column, and the radial fragment of the brachioradialis was then carefully elevated off the radial styloid, and tendon tenotomy was then carried out protecting the first dorsal compartment tendons.  Following this, the wound was then thoroughly irrigated.  After open reduction, the volar plate was then applied and then held distally with the K-wires.  Position was then confirmed using mini C-arm, and the oblong screw hole was then used  to place proximally with the appropriate drilling and depth gauge measurement.  Once this was carried out, the plate height was then adjusted and distal fixation was then carried out from an ulnar to radial direction with the distal locking pegs.  One multi-directional screw was then used.  The wound was then thoroughly irrigated.  Radiographs were then obtained which showed good alignment distally and good joint incongruity.  Final fixation was then carried out proximally where  remaining combination of locking and nonlocking screws were then used in the shaft.  Final radiographs were then obtained.  The pronator quadratus was then closed with 2-0 Vicryl.  Subcutaneous tissues closed with 4-0 Vicryl and skin closed with simple 4-0 Prolene sutures.  Adaptic dressing, sterile compressive bandage then applied.  The patient tolerated the procedure well, was placed in a well-padded sugar-tong splint, extubated, and taken to recovery room in good condition.  Postprocedure, intraoperative radiographs, 3 views of the wrist, AP, lateral, and oblique films of the wrist did show the volar plate fixation in place in good position.  POSTPROCEDURE PLAN:  The patient will be admitted overnight for IV antibiotics and pain control.  Discharge in the morning.  See her back in the office in approximately 2 weeks for wound check, suture removal, x-rays, application of short-arm cast for a total of 4 weeks immobilization and begin an outpatient therapy regimen.     Madelynn Done, MD     FWO/MEDQ  D:  06/13/2014  T:  06/14/2014  Job:  [id number]

## 2014-06-14 NOTE — Discharge Summary (Signed)
Physician Discharge Summary  Patient ID: Emma Kim MRN: [id number] DOB/AGE: Aug 25, 1955 59 y.o.  Admit date: 06/13/2014 Discharge date: 06/14/2014  Admission Diagnoses: right distal radius fracture Past Medical History  Diagnosis Date  . Vitamin D deficiency   . Heart murmur, systolic   . Abnormal EKG   . Complication of anesthesia     when she woke up, "felt like she was coming through a fog"  . Frozen shoulder 2015    left    Discharge Diagnoses:  Active Problems:   Distal radius fracture   Surgeries: Procedure(s): OPEN REDUCTION INTERNAL FIXATION (ORIF) RIGHT DISTAL RADIAL FRACTURE on 06/13/2014    Consultants:  none  Discharged Condition: Improved  Hospital Course: Emma Kim is an 59 y.o. female who was admitted 06/13/2014 with a chief complaint of No chief complaint on file. , and found to have a diagnosis of right distal radius fracture.  They were brought to the operating room on 06/13/2014 and underwent Procedure(s): OPEN REDUCTION INTERNAL FIXATION (ORIF) RIGHT DISTAL RADIAL FRACTURE.    They were given perioperative antibiotics: Anti-infectives    Start     Dose/Rate Route Frequency Ordered Stop   06/14/14 0600  ceFAZolin (ANCEF) IVPB 2 g/50 mL premix     2 g100 mL/hr over 30 Minutes Intravenous On call to O.R. 06/13/14 1434 06/13/14 1735   06/14/14 0115  ceFAZolin (ANCEF) IVPB 1 g/50 mL premix     1 g100 mL/hr over 30 Minutes Intravenous 3 times per day 06/13/14 2156     06/13/14 2200  ceFAZolin (ANCEF) IVPB 1 g/50 mL premix     1 g100 mL/hr over 30 Minutes Intravenous NOW 06/13/14 2156 06/14/14 0028   06/13/14 1504  ceFAZolin (ANCEF) 2-3 GM-% IVPB SOLR    Comments:  Ray Church   : cabinet override      06/13/14 1504 06/14/14 0314    .  They were given sequential compression devices, early ambulation, and Other (comment)ambulation for DVT prophylaxis.  Recent vital signs: Patient Vitals for the past 24 hrs:  BP Temp Temp src Pulse Resp SpO2  Height  06/14/14 0642 103/74 mmHg 97.8 F (36.6 C) Oral (!) 52 - 99 % -  06/14/14 0112 106/64 mmHg 97.7 F (36.5 C) Oral 63 - 98 % -  06/13/14 2021 132/79 mmHg 97.9 F (36.6 C) Oral 66 - 98 % -  06/13/14 1935 123/74 mmHg - - 72 13 100 % -  06/13/14 1920 126/89 mmHg - - 80 11 100 % -  06/13/14 1905 114/75 mmHg 97.5 F (36.4 C) - 78 13 99 % -  06/13/14 1443 (!) 170/97 mmHg 99 F (37.2 C) Oral 64 16 100 %  (1.575 m)  .  Recent laboratory studies: No results found.  Discharge Medications:     Medication List    STOP taking these medications        acetaminophen 325 MG tablet  Commonly known as:  TYLENOL      TAKE these medications        docusate sodium 100 MG capsule  Commonly known as:  COLACE  Take 1 capsule (100 mg total) by mouth 2 (two) times daily.     methocarbamol 500 MG tablet  Commonly known as:  ROBAXIN  Take 1 tablet (500 mg total) by mouth 4 (four) times daily.     vitamin C 500 MG tablet  Commonly known as:  ASCORBIC ACID  Take 1 tablet (500 mg total) by mouth  daily.        Diagnostic Studies: No results found.  They benefited maximally from their hospital stay and there were no complications.     Disposition: Final discharge disposition not confirmed     Discharge Instructions    Discharge patient    Complete by:  As directed   Discharge to home          Follow-up Information    Follow up with Sharma Covert, MD In 2 weeks.   Specialty:  Orthopedic Surgery   Contact information:   [address] [place] [address] [phone]        Signed: Sharma Covert 06/14/2014, 2:13 PM

## 2014-06-14 NOTE — Progress Notes (Signed)
Patient has denied pain throughout the night related to side effects of general/regional anesthesia. Nursing will continue to monitor.

## 2014-06-14 NOTE — Progress Notes (Signed)
RN repositioned and elevated patients right upper arm on 3 pillows and applied ice packs. Fingers slightly edematous (non-pitting). Nursing will continue to monitor.

## 2014-07-26 ENCOUNTER — Encounter: Payer: Self-pay | Admitting: Cardiology

## 2014-09-28 ENCOUNTER — Other Ambulatory Visit: Payer: Self-pay

## 2014-09-28 DIAGNOSIS — Z1231 Encounter for screening mammogram for malignant neoplasm of breast: Secondary | ICD-10-CM

## 2014-10-10 ENCOUNTER — Ambulatory Visit
Admission: RE | Admit: 2014-10-10 | Discharge: 2014-10-10 | Disposition: A | Discharge status: Home / Self Care | Payer: BLUE CROSS/BLUE SHIELD | Source: Ambulatory Visit

## 2014-10-10 DIAGNOSIS — Z1231 Encounter for screening mammogram for malignant neoplasm of breast: Secondary | ICD-10-CM

## 2014-11-21 ENCOUNTER — Ambulatory Visit (HOSPITAL_COMMUNITY): Payer: BLUE CROSS/BLUE SHIELD | Attending: Cardiovascular Disease

## 2014-11-21 ENCOUNTER — Other Ambulatory Visit: Payer: Self-pay

## 2014-11-21 ENCOUNTER — Other Ambulatory Visit: Payer: Self-pay | Admitting: Cardiology

## 2014-11-21 DIAGNOSIS — I428 Other cardiomyopathies: Secondary | ICD-10-CM | POA: Insufficient documentation

## 2014-11-21 MED ORDER — PERFLUTREN LIPID MICROSPHERE
3.0000 mL | Freq: Once | INTRAVENOUS | Status: AC
  Administered 2014-11-21: 3 mL via INTRAVENOUS

## 2014-11-26 ENCOUNTER — Ambulatory Visit (INDEPENDENT_AMBULATORY_CARE_PROVIDER_SITE_OTHER): Payer: BLUE CROSS/BLUE SHIELD | Admitting: Cardiology

## 2014-11-26 ENCOUNTER — Encounter: Payer: Self-pay | Admitting: Cardiology

## 2014-11-26 VITALS — BP 104/80 | HR 55 | Ht 62.0 in | Wt 137.4 lb

## 2014-11-26 DIAGNOSIS — R9431 Abnormal electrocardiogram [ECG] [EKG]: Secondary | ICD-10-CM | POA: Diagnosis not present

## 2014-11-26 DIAGNOSIS — R002 Palpitations: Secondary | ICD-10-CM

## 2014-11-26 DIAGNOSIS — I4510 Unspecified right bundle-branch block: Secondary | ICD-10-CM

## 2014-11-26 DIAGNOSIS — I450 Right fascicular block: Secondary | ICD-10-CM

## 2014-11-26 DIAGNOSIS — I428 Other cardiomyopathies: Secondary | ICD-10-CM

## 2014-11-26 NOTE — Progress Notes (Signed)
Cardiology Office Note   Date:  11/26/2014   ID:  Emma Kim, DOB 1956/02/22, MRN [id number]  PCP:  [name]., DO    Chief Complaint  Patient presents with  . Follow-up    chest pain      History of Present Illness: Emma Kim is a 59 y.o. 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  At that time she also had atypical CP which resolved with treatment of her frozen shoulder with PT.  She denies any DOE, SOB, palpitations, LE edema, dizziness or syncope.  She denies any chest pain. She has noticed that every once in a while she will feel her heart racing.      Past Medical History  Diagnosis Date  . Vitamin D deficiency   . Heart murmur, systolic   . Abnormal EKG   . Complication of anesthesia     when she woke up, "felt like she was coming through a fog"  . Frozen shoulder 2015    left    Past Surgical History  Procedure Laterality Date  . Tubal ligation    . Colonoscopy    . Open reduction internal fixation (orif) distal radial fracture Right 06/13/2014    Procedure: OPEN REDUCTION INTERNAL FIXATION (ORIF) RIGHT DISTAL RADIAL FRACTURE;  Surgeon: Sharma Covert, MD;  Location: [facility] OR;  Service: Orthopedics;  Laterality: Right;     Current Outpatient Prescriptions  Medication Sig Dispense Refill  . fluticasone (FLONASE) 50 MCG/ACT nasal spray Place 1 spray into both nostrils daily.  3   No current facility-administered medications for this visit.    Allergies:   Review of patient's allergies indicates no known allergies.    Social History:  The patient  reports that she has never smoked. She does not have any smokeless tobacco history on file. She reports that she drinks about 4.2 oz of alcohol per week. She reports that she does not use illicit drugs.   Family History:  The patient's family history includes Hyperlipidemia in her father and  mother; Hypertension in her mother.    ROS:  Please see the history of present illness.   Otherwise, review of systems are positive for none.   All other systems are reviewed and negative.    PHYSICAL EXAM: VS:  BP 104/80 mmHg  Pulse 55  Ht 5\' 2"  (1.575 m)  Wt 137 lb 6.4 oz (62.324 kg)  BMI 25.12 kg/m2 , BMI Body mass index is 25.12 kg/(m^2). GEN: Well nourished, well developed, in no acute distress HEENT: normal Neck: no JVD, carotid bruits, or masses Cardiac: RRR; no murmurs, rubs, or gallops,no edema  Respiratory:  clear to auscultation bilaterally, normal work of breathing GI: soft, nontender, nondistended, + BS MS: no deformity or atrophy Skin: warm and dry, no rash Neuro:  Strength and sensation are intact Psych: euthymic mood, full affect   EKG:  EKG was ordered today. The ekg ordered today demonstrates sinus bradycardia at 55 bpm with nonspecific ST abnormaltiy   Recent Labs: 06/13/2014: BUN 12; Creatinine, Ser 0.84; Hemoglobin 12.8; Platelets 192; Potassium 3.5; Sodium 141    Lipid Panel    Component Value Date/Time   CHOL 177 02/01/2007 0812   TRIG 51 02/01/2007 0812   HDL 62.8 02/01/2007 0812   CHOLHDL  2.8 CALC 02/01/2007 0812   VLDL 10 02/01/2007 0812   LDLCALC 104* 02/01/2007 0812      Wt Readings from Last 3 Encounters:  11/26/14 137 lb 6.4 oz (62.324 kg)  09/21/13 135 lb (61.236 kg)  02/08/07 130 lb (58.968 kg)     ASSESSMENT AND PLAN:  1. Atypical left arm and chest pain that now has resolved after treatment of a "frozen shoulder" with PT. Her symptoms don't sound cardiac and have since resolved with physical therapy. No further cardiac workup at this time unless her symptoms reoccur.  2. 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  She has no history of arrhythmias and no family history of SCD.  I will get a event monitor to assess for arrhythmias especially given her palpitations that she has been having.   3. IRBBB  4.  Palpitations - I will get an event  monitor to assess for arrhythmias.  5.  Asymptomatic bradycardia    Current medicines are reviewed at length with the patient today.  The patient does not have concerns regarding medicines.  The following changes have been made:  no change  Labs/ tests ordered today: See above Assessment and Plan No orders of the defined types were placed in this encounter.     Disposition:   FU with me in 1 year  Signed, Quintella Reichert, MD  11/26/2014 9:48 AM    [facility] [address] [place], [place], [place]  [address] Phone: [phone]; Fax: [phone]

## 2014-11-26 NOTE — Patient Instructions (Signed)
Medication Instructions:  No changes today  Labwork: None today  Testing/Procedures: Your physician has recommended that you wear an event monitor. Event monitors are medical devices that record the heart's electrical activity. Doctors most often [place] these monitors to diagnose arrhythmias. Arrhythmias are problems with the speed or rhythm of the heartbeat. The monitor is a small, portable device. You can wear one while you do your normal daily activities. This is usually used to diagnose what is causing palpitations/syncope (passing out). 30 DAYS    Follow-Up: Your physician wants you to follow-up in: 1 year with Dr Mayford Knife.(July 2017). You will receive a reminder letter in the mail two months in advance. If you don't receive a letter, please call our office to schedule the follow-up appointment.

## 2014-11-27 ENCOUNTER — Ambulatory Visit (INDEPENDENT_AMBULATORY_CARE_PROVIDER_SITE_OTHER): Payer: BLUE CROSS/BLUE SHIELD

## 2014-11-27 DIAGNOSIS — R9431 Abnormal electrocardiogram [ECG] [EKG]: Secondary | ICD-10-CM

## 2014-11-27 DIAGNOSIS — R002 Palpitations: Secondary | ICD-10-CM | POA: Diagnosis not present

## 2014-11-27 DIAGNOSIS — I428 Other cardiomyopathies: Secondary | ICD-10-CM

## 2014-11-27 DIAGNOSIS — I450 Right fascicular block: Secondary | ICD-10-CM | POA: Diagnosis not present

## 2015-01-07 ENCOUNTER — Encounter: Payer: Self-pay | Admitting: Family Medicine

## 2015-01-07 ENCOUNTER — Ambulatory Visit (INDEPENDENT_AMBULATORY_CARE_PROVIDER_SITE_OTHER): Payer: BLUE CROSS/BLUE SHIELD | Admitting: Family Medicine

## 2015-01-07 VITALS — BP 120/80 | HR 76 | Temp 98.4°F | Ht 61.5 in | Wt 137.2 lb

## 2015-01-07 DIAGNOSIS — J309 Allergic rhinitis, unspecified: Secondary | ICD-10-CM | POA: Diagnosis not present

## 2015-01-07 DIAGNOSIS — Z7189 Other specified counseling: Secondary | ICD-10-CM | POA: Diagnosis not present

## 2015-01-07 DIAGNOSIS — I428 Other cardiomyopathies: Secondary | ICD-10-CM

## 2015-01-07 DIAGNOSIS — Z7689 Persons encountering health services in other specified circumstances: Secondary | ICD-10-CM

## 2015-01-07 NOTE — Patient Instructions (Signed)
BEFORE YOU LEAVE: -schedule physical in about 3 months  If you could find the name of the doctor or report for your colonoscopy, please bring this to your appointment

## 2015-01-07 NOTE — Progress Notes (Signed)
Pre visit review using our clinic review tool, if applicable. No additional management support is needed unless otherwise documented below in the visit note. 

## 2015-01-07 NOTE — Progress Notes (Signed)
HPI:  Emma Kim is here to establish care. She used to see Dr. Yehuda Kim.  Last PCP and physical: last CPE in 09/2013 - all normal pap smears, normal mammogram recently. She has had annual physicals.  Has the following chronic problems that require follow up and concerns today:  Heart Murmur/Noncompaction cardiomyopathy/RBBB/Heart Palpitations: -seeing Dr. Mayford Kim in Cardiology, follow up and echo advised in 10/2015 -denies: CP, SOB, DOE -occ palpitations with anxiety -active: runner, exercise for 1 hour 5-6 days per week  Allergic Rhinitis: -takes flonase -nasal symptoms predominant -symptoms are worse in the spring, but some symptoms other times -denies: nosebleeds, asthma   ROS negative for unless reported above: fevers, unintentional weight loss, hearing or vision loss, chest pain, palpitations, struggling to breath, hemoptysis, melena, hematochezia, hematuria, falls, loc, si, thoughts of self harm  Past Medical History  Diagnosis Date  . Vitamin D deficiency   . Noncompaction cardiomyopathy     sees Dr. Mayford Kim  . Abnormal EKG   . Complication of anesthesia     when she woke up, "felt like she was coming through a fog"  . Frozen shoulder 2015    left  . Distal radius fracture 06/13/2014    Past Surgical History  Procedure Laterality Date  . Tubal ligation    . Colonoscopy    . Open reduction internal fixation (orif) distal radial fracture Right 06/13/2014    Procedure: OPEN REDUCTION INTERNAL FIXATION (ORIF) RIGHT DISTAL RADIAL FRACTURE;  Surgeon: Emma Covert, MD;  Location: [facility] OR;  Service: Orthopedics;  Laterality: Right;    Family History  Problem Relation Age of Onset  . Hypertension Mother   . Hyperlipidemia Mother   . Hyperlipidemia Father   . Alcoholism Maternal Grandfather   . Alcoholism Maternal Grandmother     Social History   Social History  . Marital Status: Married    Spouse Name: N/A  . Number of Children: N/A  . Years of Education: N/A    Social History Main Topics  . Smoking status: Never Smoker   . Smokeless tobacco: None  . Alcohol Use: 4.2 oz/week    7 Glasses of wine per week     Comment: 1 glass of win nightly  . Drug Use: No  . Sexual Activity: Not Asked   Other Topics Concern  . 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  fluticasone (FLONASE) 50 MCG/ACT nasal spray, Place 1 spray into both nostrils daily., Disp: , Rfl: 3  EXAM:  Filed Vitals:   01/07/15 1414  BP: 120/80  Pulse: 76  Temp: 98.4 F (36.9 C)    Body mass index is 25.51 kg/(m^2).  GENERAL: vitals reviewed and listed above, alert, oriented, appears well hydrated and in no acute distress  HEENT: atraumatic, conjunttiva clear, no obvious abnormalities on inspection of external nose and ears  NECK: no obvious masses on inspection  LUNGS: clear to auscultation bilaterally, no wheezes, rales or rhonchi, good air movement  CV: HRRR, no peripheral edema  MS: moves all extremities without noticeable abnormality  PSYCH: pleasant and cooperative, no obvious depression or anxiety  ASSESSMENT AND PLAN:  Discussed the following assessment and plan:  Encounter to establish care  Allergic rhinitis, unspecified allergic rhinitis type  Noncompaction cardiomyopathy  -We reviewed the PMH, PSH, FH, SH, Meds and Allergies. -We provided refills for any medications we will prescribe as needed. -We addressed current concerns per orders and patient instructions. -We have asked for records for pertinent exams, studies, vaccines and notes from previous providers. -We have advised patient to follow up per instructions below.   -Patient advised to return or notify a doctor immediately if symptoms worsen or persist or new  concerns arise.  Patient Instructions  BEFORE YOU LEAVE: -schedule physical in about 3 months  If you could find the name of the doctor or report for your colonoscopy, please bring this to your appointment     Emma Kim.

## 2015-03-05 ENCOUNTER — Encounter: Payer: Self-pay | Admitting: Family Medicine

## 2015-03-13 ENCOUNTER — Encounter: Payer: Self-pay | Admitting: Family Medicine

## 2015-05-01 ENCOUNTER — Ambulatory Visit (INDEPENDENT_AMBULATORY_CARE_PROVIDER_SITE_OTHER): Payer: BLUE CROSS/BLUE SHIELD | Admitting: Family Medicine

## 2015-05-01 ENCOUNTER — Other Ambulatory Visit (HOSPITAL_COMMUNITY)
Admission: RE | Admit: 2015-05-01 | Discharge: 2015-05-01 | Disposition: A | Discharge status: Home / Self Care | Payer: BLUE CROSS/BLUE SHIELD | Source: Ambulatory Visit | Attending: Family Medicine | Admitting: Family Medicine

## 2015-05-01 ENCOUNTER — Encounter: Payer: Self-pay | Admitting: Family Medicine

## 2015-05-01 VITALS — BP 120/84 | HR 63 | Temp 98.0°F | Ht 61.75 in | Wt 138.5 lb

## 2015-05-01 DIAGNOSIS — Z0000 Encounter for general adult medical examination without abnormal findings: Secondary | ICD-10-CM

## 2015-05-01 DIAGNOSIS — Z124 Encounter for screening for malignant neoplasm of cervix: Secondary | ICD-10-CM

## 2015-05-01 DIAGNOSIS — Z01419 Encounter for gynecological examination (general) (routine) without abnormal findings: Secondary | ICD-10-CM | POA: Diagnosis present

## 2015-05-01 DIAGNOSIS — Z23 Encounter for immunization: Secondary | ICD-10-CM

## 2015-05-01 DIAGNOSIS — Z1151 Encounter for screening for human papillomavirus (HPV): Secondary | ICD-10-CM | POA: Diagnosis present

## 2015-05-01 DIAGNOSIS — I428 Other cardiomyopathies: Secondary | ICD-10-CM

## 2015-05-01 LAB — LIPID PANEL
CHOL/HDL RATIO: 3
CHOLESTEROL: 218 mg/dL — AB (ref 0–200)
HDL: 70.8 mg/dL (ref >39.00)
LDL CALC: 135 mg/dL — AB (ref 0–99)
NonHDL: 146.76
Triglycerides: 61 mg/dL (ref 0.0–149.0)
VLDL: 12.2 mg/dL (ref 0.0–40.0)

## 2015-05-01 LAB — HEMOGLOBIN A1C: HEMOGLOBIN A1C: 5.7 % (ref 4.6–6.5)

## 2015-05-01 MED ORDER — FLUTICASONE PROPIONATE 50 MCG/ACT NA SUSP: 1.0000 | Freq: Every day | NASAL | Status: DC

## 2015-05-01 NOTE — Progress Notes (Signed)
Pre visit review using our clinic review tool, if applicable. No additional management support is needed unless otherwise documented below in the visit note. 

## 2015-05-01 NOTE — Patient Instructions (Signed)
BEFORE YOU LEAVE: -flu shot -labs -follow up yearly  -We have ordered labs or studies at this visit. It can take up to 1-2 weeks for results and processing. We will contact you with instructions IF your results are abnormal. Normal results will be released to your [facility]. If you have not heard from [place] or can not find your results in [facility] in 2 weeks please contact our office.  We recommend the following healthy lifestyle measures: - eat a healthy whole foods diet consisting of regular small meals composed of vegetables, fruits, beans, nuts, seeds, healthy meats such as white chicken and fish and whole grains.  - avoid sweets, white starchy foods, fried foods, fast food, processed foods, sodas, red meet and other fattening foods.  - get a least 150-300 minutes of aerobic exercise per week.

## 2015-05-01 NOTE — Progress Notes (Signed)
HPI:  Here for CPE:  -Concerns and/or follow up today:   Heart Murmur/Noncompaction cardiomyopathy/RBBB/Heart Palpitations: -seeing Dr. Mayford Knife in Cardiology, follow up and echo advised in 10/2015 -denies: CP, SOB, DOE -occ palpitations with anxiety - reports had eval with holter that was normal -active: runner, exercise for 1 hour 5-6 days per week  Allergic Rhinitis: -takes flonase -wants refill -nasal symptoms predominant -symptoms are worse in the spring, but some symptoms other times -denies: nosebleeds, asthma  -Diet: variety of foods, balance and well rounded  -Exercise:  regular exercise  -Taking folic acid, vitamin D or calcium:yes  -Diabetes and Dyslipidemia Screening: today  -Hx of HTN: no  -Vaccines: want flu vaccine today  -pap history: last pap in chart from 2011, normal - with prior PCP  -FDLMP:  -sexual activity: yes, female partner, no new partners  -wants STI testing (Hep C if born [birth year]): no  -FH breast, colon or ovarian ca: see FH Last mammogram: 09/2014 Last colon cancer screening:  Breast Ca Risk Assessment: -see PMH and FH   -Alcohol, Tobacco, drug use: see social history  Review of Systems - no fevers, unintentional weight loss, vision loss, hearing loss, chest pain, sob, hemoptysis, melena, hematochezia, hematuria, genital discharge, changing or concerning skin lesions, bleeding, bruising, loc, thoughts of self harm or SI  Past Medical History  Diagnosis Date  . Vitamin D deficiency   . Noncompaction cardiomyopathy [facility])     sees Dr. Mayford Knife  . Abnormal EKG   . Complication of anesthesia     when she woke up, "felt like she was coming through a fog"  . Frozen shoulder 2015    left  . Distal radius fracture 06/13/2014    Past Surgical History  Procedure Laterality Date  . Tubal ligation    . Colonoscopy    . Open reduction internal fixation (orif) distal radial fracture Right 06/13/2014    Procedure: OPEN REDUCTION INTERNAL  FIXATION (ORIF) RIGHT DISTAL RADIAL FRACTURE;  Surgeon: Sharma Covert, MD;  Location: [facility] OR;  Service: Orthopedics;  Laterality: Right;    Family History  Problem Relation Age of Onset  . Hypertension Mother   . Hyperlipidemia Mother   . Hyperlipidemia Father   . Alcoholism Maternal Grandfather   . Alcoholism Maternal Grandmother     Social History   Social History  . Marital Status: Married    Spouse Name: N/A  . Number of Children: N/A  . Years of Education: N/A   Social History Main Topics  . Smoking status: Never Smoker   . Smokeless tobacco: None  . Alcohol Use: 4.2 oz/week    7 Glasses of wine per week     Comment: 1 glass of win nightly  . Drug Use: No  . Sexual Activity: Not Asked   Other Topics Concern  . 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  cholecalciferol (VITAMIN D) 1000 UNITS tablet, Take 1,000 Units by mouth daily., Disp: , Rfl:  .  fluticasone (FLONASE) 50 MCG/ACT nasal spray, Place 1 spray into both nostrils daily., Disp: 16 g, Rfl: 6  EXAM:  Filed Vitals:   05/01/15 0905  BP: 120/84  Pulse: 63  Temp: 98 F (36.7 C)    GENERAL: vitals reviewed and listed below, alert, oriented, appears well hydrated and in no acute distress  HEENT: head atraumatic, PERRLA, normal appearance of eyes, ears, nose and mouth. moist mucus membranes.  NECK: supple, no masses or lymphadenopathy  LUNGS: clear to auscultation bilaterally, no rales, rhonchi or wheeze  CV: HRRR, no peripheral edema or cyanosis, normal pedal pulses  BREAST: normal appearance - no lesions or discharge, on palpation normal breast tissue without any suspicious masses  ABDOMEN: bowel sounds normal, soft, non tender to palpation, no masses, no rebound or  guarding  GU: normal appearance of external genitalia - no lesions or masses, normal vaginal mucosa - no abnormal discharge, normal appearance of cervix, pap obtained  SKIN: no rash or abnormal lesions  MS: normal gait, moves all extremities normally  NEURO: CN II-XII grossly intact, normal muscle strength and sensation to light touch on extremities  PSYCH: normal affect, pleasant and cooperative  ASSESSMENT AND PLAN:  Discussed the following assessment and plan:  Visit for preventive health examination - Plan: Lipid Panel, Hemoglobin A1c, Hepatitis C antibody  Noncompaction cardiomyopathy (HCC)  -Discussed and advised all [place] preventive services health task force level A and B recommendations for age, sex and risks.  -Advised at least 150 minutes of exercise per week and a healthy diet low in saturated fats and sweets and consisting of fresh fruits and vegetables, lean meats such as fish and white chicken and whole grains.  -FASTING labs, studies and vaccines per orders this encounter  Orders Placed This Encounter  Procedures  . Lipid Panel  . Hemoglobin A1c  . Hepatitis C antibody    Patient advised to return to clinic immediately if symptoms worsen or persist or new concerns.  Patient Instructions  BEFORE YOU LEAVE: -flu shot -labs -follow up yearly  -We have ordered labs or studies at this visit. It can take up to 1-2 weeks for results and processing. We will contact you with instructions IF your results are abnormal. Normal results will be released to your [facility]. If you have not heard from [place] or can not find your results in [facility] in 2 weeks please contact our office.  We recommend the following healthy lifestyle measures: - eat a healthy whole foods diet consisting of regular small meals composed of vegetables, fruits, beans, nuts, seeds, healthy meats such as white chicken and fish and whole grains.  - avoid sweets, white starchy foods, fried foods, fast food,  processed foods, sodas, red meet and other fattening foods.  - get a least 150-300 minutes of aerobic exercise per week.           No Follow-up on file.  Kriste Basque R.

## 2015-05-01 NOTE — Addendum Note (Signed)
Addended by: Johnella Moloney on: 05/01/2015 10:29 AM   Modules accepted: Orders

## 2015-05-02 LAB — HEPATITIS C ANTIBODY: HCV Ab: NEGATIVE

## 2015-05-03 LAB — CYTOLOGY - PAP

## 2015-11-08 ENCOUNTER — Other Ambulatory Visit: Payer: Self-pay | Admitting: Family Medicine

## 2015-11-08 DIAGNOSIS — Z1231 Encounter for screening mammogram for malignant neoplasm of breast: Secondary | ICD-10-CM

## 2015-11-21 ENCOUNTER — Ambulatory Visit
Admission: RE | Admit: 2015-11-21 | Discharge: 2015-11-21 | Disposition: A | Discharge status: Home / Self Care | Payer: BLUE CROSS/BLUE SHIELD | Source: Ambulatory Visit | Attending: Family Medicine | Admitting: Family Medicine

## 2015-11-21 DIAGNOSIS — Z1231 Encounter for screening mammogram for malignant neoplasm of breast: Secondary | ICD-10-CM

## 2015-12-22 NOTE — Progress Notes (Signed)
Cardiology Office Note    Date:  12/23/2015   ID:  Emma Kim, DOB 02/11/1956, MRN [id number]  PCP:  [name]., DO  Cardiologist:  Armanda Magic, MD   Chief Complaint  Patient presents with  . Follow-up    LV noncompaction    History of Present Illness:  Emma Kim is a 60 y.o. female with a no history of CAD who presents today for followup of LV noncompaction first noted on echo and confirmed by MRI.  At that time she also had atypical CP which resolved with treatment of her frozen shoulder with PT.  She denies any DOE, SOB, palpitations, LE edema, dizziness or syncope.  She denies any chest pain.    Past Medical History:  Diagnosis Date  . Abnormal EKG   . Complication of anesthesia    when she woke up, "felt like she was coming through a fog"  . Distal radius fracture 06/13/2014  . 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 Noncompaction cardiomyopathy [facility])    sees Dr. Mayford Knife  . Vitamin D deficiency     Past Surgical History:  Procedure Laterality Date  . COLONOSCOPY    . OPEN REDUCTION INTERNAL FIXATION (ORIF) DISTAL RADIAL FRACTURE Right 06/13/2014   Procedure: OPEN REDUCTION INTERNAL FIXATION (ORIF) RIGHT DISTAL RADIAL FRACTURE;  Surgeon: Sharma Covert, MD;  Location: [facility] OR;  Service: Orthopedics;  Laterality: Right;  . TUBAL LIGATION      Current Medications: Outpatient Medications Prior to Visit  Medication Sig Dispense Refill  . cholecalciferol (VITAMIN D) 1000 UNITS tablet Take 1,000 Units by mouth daily.    . fluticasone (FLONASE) 50 MCG/ACT nasal spray Place 1 spray into both nostrils daily. 16 g 6   No facility-administered medications prior to visit.      Allergies:   Review of patient's allergies indicates no known allergies.   Social History   Social History  . Marital status: Married    Spouse name: N/A  . Number of children: N/A  . Years of education: N/A   Social History Main Topics  . Smoking status: Never Smoker  .  Smokeless tobacco: Never Used  . Alcohol use 4.2 oz/week    7 Glasses of wine per week     Comment: 1 glass of win nightly  . Drug use: No  . Sexual activity: Not Asked   Other Topics Concern  . 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   ROS:   Please see the history of present illness.    ROS All other systems reviewed and are negative.   PHYSICAL EXAM:   VS:  BP 124/78   Pulse 60   Ht 5' 1.75" (1.568 m)   Wt 133 lb 6.4 oz (60.5 kg)   BMI 24.60 kg/m    GEN: Well nourished, well developed, in no acute distress  HEENT: normal  Neck: no JVD, carotid bruits, or masses Cardiac: RRR; no murmurs, rubs, or gallops,no edema.  Intact distal pulses bilaterally.  Respiratory:  clear to  auscultation bilaterally, normal work of breathing GI: soft, nontender, nondistended, + BS MS: no deformity or atrophy  Skin: warm and dry, no rash Neuro:  Alert and Oriented x 3, Strength and sensation are intact Psych: euthymic mood, full affect  Wt Readings from Last 3 Encounters:  12/23/15 133 lb 6.4 oz (60.5 kg)  05/01/15 138 lb 8 oz (62.8 kg)  01/07/15 137 lb 3.2 oz (62.2 kg)      Studies/Labs Reviewed:   EKG:  EKG is ordered today.  The ekg ordered today demonstrates ectopic atrial bradycardia with LAFB and IRBBB and nonspecific T wave abnormality unchanged from prior EKG  Recent Labs: No results found for requested labs within last 8760 hours.   Lipid Panel    Component Value Date/Time   CHOL 218 (H) 05/01/2015 0958   TRIG 61.0 05/01/2015 0958   HDL 70.80 05/01/2015 0958   CHOLHDL 3 05/01/2015 0958   VLDL  12.2 05/01/2015 0958   LDLCALC 135 (H) 05/01/2015 0958    Additional studies/ records that were reviewed today include:  none    ASSESSMENT:    1. Noncompaction cardiomyopathy (HCC)   2. Incomplete RBBB      PLAN:  In order of problems listed above:  1. LV noncompaction - she is asymptomatic.  Repeat echo to assess for stability of LVF.  Will also check 48 hour Holter to assess for arrhythmias.  2. Incomplete RBBB    Medication Adjustments/Labs and Tests Ordered: Current medicines are reviewed at length with the patient today.  Concerns regarding medicines are outlined above.  Medication changes, Labs and Tests ordered today are listed in the Patient Instructions below.  There are no Patient Instructions on file for this visit.   Signed, Armanda Magic, MD  12/23/2015 10:42 AM    [facility] [address] [place], [place], [place]  [address] Phone: [phone]; Fax: [phone]

## 2015-12-23 ENCOUNTER — Ambulatory Visit (INDEPENDENT_AMBULATORY_CARE_PROVIDER_SITE_OTHER): Payer: BLUE CROSS/BLUE SHIELD | Admitting: Cardiology

## 2015-12-23 ENCOUNTER — Encounter: Payer: Self-pay | Admitting: Cardiology

## 2015-12-23 VITALS — BP 124/78 | HR 60 | Ht 61.75 in | Wt 133.4 lb

## 2015-12-23 DIAGNOSIS — I450 Right fascicular block: Secondary | ICD-10-CM

## 2015-12-23 DIAGNOSIS — I4510 Unspecified right bundle-branch block: Secondary | ICD-10-CM

## 2015-12-23 DIAGNOSIS — I428 Other cardiomyopathies: Secondary | ICD-10-CM | POA: Diagnosis not present

## 2015-12-23 NOTE — Patient Instructions (Addendum)
Medication Instructions:  Your physician recommends that you continue on your current medications as directed. Please refer to the Current Medication list given to you today.   Labwork: None  Testing/Procedures: Your physician has requested that you have an echocardiogram. Echocardiography is a painless test that uses sound waves to create images of your heart. It provides your doctor with information about the size and shape of your heart and how well your heart's chambers and valves are working. This procedure takes approximately one hour. There are no restrictions for this procedure.   Your physician has recommended that you wear a holter monitor. Holter monitors are medical devices that record the heart's electrical activity. Doctors most often use these monitors to diagnose arrhythmias. Arrhythmias are problems with the speed or rhythm of the heartbeat. The monitor is a small, portable device. You can wear one while you do your normal daily activities. This is usually used to diagnose what is causing palpitations/syncope (passing out).  Follow-Up: Your physician wants you to follow-up in: 1 year with Dr. Mayford Knife. You will receive a reminder letter in the mail two months in advance. If you don't receive a letter, please call our office to schedule the follow-up appointment.   Any Other Special Instructions Will Be Listed Below (If Applicable).     If you need a refill on your cardiac medications before your next appointment, please call your pharmacy.

## 2016-01-13 DIAGNOSIS — H5213 Myopia, bilateral: Secondary | ICD-10-CM | POA: Diagnosis not present

## 2016-01-22 ENCOUNTER — Other Ambulatory Visit: Payer: Self-pay

## 2016-01-22 ENCOUNTER — Ambulatory Visit (INDEPENDENT_AMBULATORY_CARE_PROVIDER_SITE_OTHER): Payer: BLUE CROSS/BLUE SHIELD

## 2016-01-22 ENCOUNTER — Ambulatory Visit (HOSPITAL_COMMUNITY): Payer: BLUE CROSS/BLUE SHIELD | Attending: Internal Medicine

## 2016-01-22 DIAGNOSIS — I450 Right fascicular block: Secondary | ICD-10-CM | POA: Insufficient documentation

## 2016-01-22 DIAGNOSIS — I428 Other cardiomyopathies: Secondary | ICD-10-CM

## 2016-01-22 DIAGNOSIS — I340 Nonrheumatic mitral (valve) insufficiency: Secondary | ICD-10-CM | POA: Insufficient documentation

## 2016-01-22 DIAGNOSIS — I4510 Unspecified right bundle-branch block: Secondary | ICD-10-CM

## 2016-01-22 DIAGNOSIS — I5189 Other ill-defined heart diseases: Secondary | ICD-10-CM | POA: Diagnosis not present

## 2016-01-22 DIAGNOSIS — I071 Rheumatic tricuspid insufficiency: Secondary | ICD-10-CM | POA: Insufficient documentation

## 2016-02-05 ENCOUNTER — Encounter: Payer: Self-pay | Admitting: Cardiology

## 2016-02-05 DIAGNOSIS — I491 Atrial premature depolarization: Secondary | ICD-10-CM | POA: Insufficient documentation

## 2016-02-05 DIAGNOSIS — I493 Ventricular premature depolarization: Secondary | ICD-10-CM | POA: Insufficient documentation

## 2016-02-12 ENCOUNTER — Ambulatory Visit (INDEPENDENT_AMBULATORY_CARE_PROVIDER_SITE_OTHER): Payer: BLUE CROSS/BLUE SHIELD | Admitting: *Deleted

## 2016-02-12 DIAGNOSIS — Z23 Encounter for immunization: Secondary | ICD-10-CM | POA: Diagnosis not present

## 2016-03-09 DIAGNOSIS — L814 Other melanin hyperpigmentation: Secondary | ICD-10-CM | POA: Diagnosis not present

## 2016-03-09 DIAGNOSIS — Z86018 Personal history of other benign neoplasm: Secondary | ICD-10-CM | POA: Diagnosis not present

## 2016-03-09 DIAGNOSIS — L821 Other seborrheic keratosis: Secondary | ICD-10-CM | POA: Diagnosis not present

## 2016-03-09 DIAGNOSIS — D1801 Hemangioma of skin and subcutaneous tissue: Secondary | ICD-10-CM | POA: Diagnosis not present

## 2016-08-24 ENCOUNTER — Encounter: Payer: BLUE CROSS/BLUE SHIELD | Admitting: Family Medicine

## 2016-09-01 ENCOUNTER — Encounter: Payer: BLUE CROSS/BLUE SHIELD | Admitting: Family Medicine

## 2016-09-14 NOTE — Progress Notes (Signed)
HPI:  Here for CPE: Due for labs, mammo in July Does skin check with dermatologist and declines full skin exam here. -Concerns and/or follow up today: none, not in office for 2 years. PMH mild hyperlipidemia, mild prediabetes,  allergies, heart dz - sees Cardiologist for management.  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 Mayford Knife in Cardiology -active: runner, exercise for 1 hour 5-6 days per week  -Diet: variety of foods, balance and well rounded  -Exercise: regular exercise - doing the Burn  -Taking folic acid, vitamin D or calcium: yes vit D3 daily  -Diabetes and Dyslipidemia Screening: fasting for labs  -Vaccines: UTD  -pap history: 04/2015 normal, HPV nect  -sexual activity: yes, female partner, no new partners  -wants STI testing (Hep C if born 1944-65): no  -FH breast, colon or ovarian ca: see FH Last mammogram: July 2017, birads 1 Last colon cancer screening: 09/2008, [facility], repeat in 10 years per abstraction report review  -Alcohol, Tobacco, drug use: see social history  Review of Systems - no fevers, unintentional weight loss, vision loss, hearing loss, chest pain, sob, hemoptysis, melena, hematochezia, hematuria, genital discharge, changing or concerning skin lesions, bleeding, bruising, loc, thoughts of self harm or SI  Past Medical History:  Diagnosis Date  . Abnormal EKG   . Asymptomatic PVCs   . Complication of anesthesia    when she woke up, "felt like she was coming through a fog"  . Distal radius fracture 06/13/2014  . 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 Mayford Knife  . PAC (premature atrial contraction)   . Vitamin D deficiency     Past Surgical History:  Procedure Laterality Date  . COLONOSCOPY    . OPEN REDUCTION INTERNAL FIXATION (ORIF) DISTAL RADIAL FRACTURE Right 06/13/2014   Procedure: OPEN REDUCTION INTERNAL FIXATION (ORIF) RIGHT DISTAL RADIAL FRACTURE;  Surgeon:  Sharma Covert, MD;  Location: [facility] OR;  Service: Orthopedics;  Laterality: Right;  . TUBAL LIGATION      Family History  Problem Relation Age of Onset  . Hypertension Mother   . Hyperlipidemia Mother   . Hyperlipidemia Father   . Alcoholism Maternal Grandfather   . Alcoholism Maternal Grandmother     Social History   Social History  . Marital status: Married    Spouse name: N/A  . Number of children: N/A  . Years of education: N/A   Social History Main Topics  . Smoking status: Never Smoker  . Smokeless tobacco: Never Used  . Alcohol use 4.2 oz/week    7 Glasses of wine per week     Comment: 1 glass of win nightly  . Drug use: No  . Sexual activity: Not Asked   Other Topics Concern  . 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  cholecalciferol (VITAMIN D) 1000 UNITS tablet, Take 1,000 Units by mouth daily., Disp: , Rfl:  .  fluticasone (FLONASE) 50 MCG/ACT nasal spray, Place 1 spray into both nostrils daily., Disp: 16 g, Rfl: 6  EXAM:  Vitals:   09/15/16 0738  BP: 120/78  Pulse: (!) 59  Temp: 98.3 F (36.8 C)  GENERAL: vitals reviewed and listed below, alert, oriented, appears well hydrated and in no acute distress  HEENT: head atraumatic, PERRLA, normal appearance of eyes, ears, nose and mouth. moist mucus membranes.  NECK: supple, no masses or lymphadenopathy  LUNGS: clear to auscultation bilaterally, no rales, rhonchi or wheeze  CV: HRRR, no peripheral edema or cyanosis, normal pedal pulses  ABDOMEN: bowel sounds normal, soft, non tender to palpation, no masses, no rebound or guarding  BREAST: normal appearance - no skin lesions or discharge noted on inspection of both breasts, on palpation of both breast and axillary region no  suspicious lesions appreciated today  GU: declined  SKIN: no rash or abnormal lesions  MS: normal gait, moves all extremities normally  NEURO: normal gait, speech and thought processing grossly intact, muscle tone grossly intact throughout  PSYCH: normal affect, pleasant and cooperative  ASSESSMENT AND PLAN:  Discussed the following assessment and plan:  Encounter for preventative adult health care examination  Noncompaction cardiomyopathy (HCC)  Hyperlipidemia, unspecified hyperlipidemia type - Plan: Lipid panel  Hyperglycemia - Plan: Hemoglobin A1c   -Discussed and advised all [place] preventive services health task force level A and B recommendations for age, sex and risks.  -Advised at least 150 minutes of exercise per week and a healthy diet with avoidance of (less then 1 serving per week) processed foods, white starches, red meat, fast foods and sweets and consisting of: * 5-9 servings of fresh fruits and vegetables (not corn or potatoes) *nuts and seeds, beans *olives and olive oil *lean meats such as fish and white chicken  *whole grains  -FASTING labs, studies and vaccines per orders this encounter  -few possible tendon cysts L hand/arm - she declined referral for now  Orders Placed This Encounter  Procedures  . Lipid panel  . Hemoglobin A1c    Patient advised to return to clinic immediately if symptoms worsen or persist or new concerns.  Patient Instructions  BEFORE YOU LEAVE: -follow up: yearly for physical - you will be due for your pap smear next year -labs  We have ordered labs or studies at this visit. It can take up to 1-2 weeks for results and processing. IF results require follow up or explanation, we will call you with instructions. Clinically stable results will be released to your [facility]. If you have not heard from [place] or cannot find your results in [facility] in 2 weeks please contact our office at [phone].  If you are not yet signed up for  [facility], please consider signing up.  Advise regular aerobic exercise (at least 150 minutes per week of sweaty exercise) and a healthy diet. Try to eat at least 5-9 servings of vegetables and fruits per day (not corn, potatoes or bananas.) Avoid sweets, red meat, pork, butter, fried foods, fast food, processed food, excessive dairy, eggs and coconut. Replace bad fats with good fats - fish, nuts and seeds, canola oil, olive oil.   WE NOW OFFER   [facility] FAST TRACK!!!  SAME DAY Appointments for ACUTE CARE  Such as: Sprains, Injuries, cuts, abrasions, rashes, muscle pain, joint pain, back pain Colds, flu, sore throats, headache, allergies, cough, fever  Ear pain, sinus and eye infections Abdominal pain, nausea, vomiting, diarrhea, upset stomach Animal/insect bites  3 Easy Ways to Schedule: Walk-In Scheduling Call in scheduling Mychart Sign-up: [email or web address]                No Follow-up on file.  Kriste Basque R., DO

## 2016-09-15 ENCOUNTER — Encounter: Payer: Self-pay | Admitting: Family Medicine

## 2016-09-15 ENCOUNTER — Ambulatory Visit (INDEPENDENT_AMBULATORY_CARE_PROVIDER_SITE_OTHER): Payer: BLUE CROSS/BLUE SHIELD | Admitting: Family Medicine

## 2016-09-15 VITALS — BP 120/78 | HR 59 | Temp 98.3°F | Ht 62.0 in | Wt 139.0 lb

## 2016-09-15 DIAGNOSIS — I428 Other cardiomyopathies: Secondary | ICD-10-CM

## 2016-09-15 DIAGNOSIS — E785 Hyperlipidemia, unspecified: Secondary | ICD-10-CM

## 2016-09-15 DIAGNOSIS — R739 Hyperglycemia, unspecified: Secondary | ICD-10-CM | POA: Diagnosis not present

## 2016-09-15 DIAGNOSIS — Z0000 Encounter for general adult medical examination without abnormal findings: Secondary | ICD-10-CM | POA: Diagnosis not present

## 2016-09-15 LAB — LIPID PANEL
CHOL/HDL RATIO: 3
Cholesterol: 211 mg/dL — ABNORMAL HIGH (ref 0–200)
HDL: 75.5 mg/dL (ref >39.00)
LDL Cholesterol: 121 mg/dL — ABNORMAL HIGH (ref 0–99)
NONHDL: 135.66
Triglycerides: 72 mg/dL (ref 0.0–149.0)
VLDL: 14.4 mg/dL (ref 0.0–40.0)

## 2016-09-15 LAB — HEMOGLOBIN A1C: HEMOGLOBIN A1C: 5.8 % (ref 4.6–6.5)

## 2016-09-15 MED ORDER — FLUTICASONE PROPIONATE 50 MCG/ACT NA SUSP: 1.0000 | Freq: Every day | NASAL | 6 refills | Status: DC

## 2016-09-15 NOTE — Patient Instructions (Addendum)
BEFORE YOU LEAVE: -follow up: yearly for physical - you will be due for your pap smear next year -labs  We have ordered labs or studies at this visit. It can take up to 1-2 weeks for results and processing. IF results require follow up or explanation, we will call you with instructions. Clinically stable results will be released to your [facility]. If you have not heard from [place] or cannot find your results in [facility] in 2 weeks please contact our office at [phone].  If you are not yet signed up for [facility], please consider signing up.  Advise regular aerobic exercise (at least 150 minutes per week of sweaty exercise) and a healthy diet. Try to eat at least 5-9 servings of vegetables and fruits per day (not corn, potatoes or bananas.) Avoid sweets, red meat, pork, butter, fried foods, fast food, processed food, excessive dairy, eggs and coconut. Replace bad fats with good fats - fish, nuts and seeds, canola oil, olive oil.   WE NOW OFFER   [facility] FAST TRACK!!!  SAME DAY Appointments for ACUTE CARE  Such as: Sprains, Injuries, cuts, abrasions, rashes, muscle pain, joint pain, back pain Colds, flu, sore throats, headache, allergies, cough, fever  Ear pain, sinus and eye infections Abdominal pain, nausea, vomiting, diarrhea, upset stomach Animal/insect bites  3 Easy Ways to Schedule: Walk-In Scheduling Call in scheduling Mychart Sign-up: [email or web address]

## 2016-09-15 NOTE — Progress Notes (Signed)
Pre visit review using our clinic review tool, if applicable. No additional management support is needed unless otherwise documented below in the visit note. 

## 2016-12-08 DIAGNOSIS — H5213 Myopia, bilateral: Secondary | ICD-10-CM | POA: Diagnosis not present

## 2017-02-04 ENCOUNTER — Encounter: Payer: Self-pay | Admitting: Family Medicine

## 2017-02-16 ENCOUNTER — Ambulatory Visit: Payer: BLUE CROSS/BLUE SHIELD | Admitting: Cardiology

## 2017-02-16 ENCOUNTER — Ambulatory Visit (INDEPENDENT_AMBULATORY_CARE_PROVIDER_SITE_OTHER): Payer: BLUE CROSS/BLUE SHIELD | Admitting: Cardiology

## 2017-02-16 ENCOUNTER — Encounter: Payer: Self-pay | Admitting: Cardiology

## 2017-02-16 VITALS — BP 132/78 | HR 66 | Ht 62.0 in | Wt 133.2 lb

## 2017-02-16 DIAGNOSIS — I4510 Unspecified right bundle-branch block: Secondary | ICD-10-CM | POA: Diagnosis not present

## 2017-02-16 DIAGNOSIS — I493 Ventricular premature depolarization: Secondary | ICD-10-CM | POA: Diagnosis not present

## 2017-02-16 DIAGNOSIS — I428 Other cardiomyopathies: Secondary | ICD-10-CM

## 2017-02-16 NOTE — Progress Notes (Signed)
Cardiology Office Note:    Date:  02/16/2017   ID:  [name], DOB 1956-02-28, MRN [id number]  PCP:  [name], DO  Cardiologist:  Armanda Magic, MD   Referring MD: [name], DO   Chief Complaint  Patient presents with  . Follow-up    LV noncompaction    History of Present Illness:    Emma Kim is a 61 y.o. 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  At that time she also had atypical CP which resolved with treatment of her frozen shoulder with PT.  She is here today for followup and is doing well.  She denies any chest pain or pressure, SOB, DOE, PND, orthopnea, LE edema, dizziness, palpitations or syncope. She is compliant with her meds and is tolerating meds with no SE.    Past Medical History:  Diagnosis Date  . Abnormal EKG   . Asymptomatic PVCs   . Complication of anesthesia    when she woke up, "felt like she was coming through a fog"  . Distal radius fracture 06/13/2014  . 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 Mayford Knife  . PAC (premature atrial contraction)   . Vitamin D deficiency     Past Surgical History:  Procedure Laterality Date  . COLONOSCOPY    . OPEN REDUCTION INTERNAL FIXATION (ORIF) DISTAL RADIAL FRACTURE Right 06/13/2014   Procedure: OPEN REDUCTION INTERNAL FIXATION (ORIF) RIGHT DISTAL RADIAL FRACTURE;  Surgeon: Sharma Covert, MD;  Location: [facility] OR;  Service: Orthopedics;  Laterality: Right;  . TUBAL LIGATION      Current Medications: Current Meds  Medication Sig  . cholecalciferol (VITAMIN D) 1000 UNITS tablet Take 1,000 Units by mouth daily.  . fluticasone (FLONASE) 50 MCG/ACT nasal spray Place 1 spray into both nostrils daily.     Allergies:   Bee pollen   Social History   Social History  . Marital status: Married    Spouse name: N/A  . Number of children: N/A  . Years of education: N/A   Social History Main Topics    . Smoking status: Never Smoker  . Smokeless tobacco: Never Used  . Alcohol use 4.2 oz/week    7 Glasses of wine per week     Comment: 1 glass of win nightly  . Drug use: No  . Sexual activity: Not Asked   Other Topics Concern  . 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  ROS:   Please see the history of present illness.    ROS  All other systems reviewed and negative.   EKGs/Labs/Other Studies Reviewed:    The following studies were reviewed today: none  EKG:  EKG is  ordered today.  The ekg ordered today demonstrates NSR at 66bpm with IRBBB and LAFB with nonspecific ST abnormality  Recent Labs: No results found for requested labs within last 8760 hours.   Recent Lipid Panel    Component Value  Date/Time   CHOL 211 (H) 09/15/2016 0829   TRIG 72.0 09/15/2016 0829   HDL 75.50 09/15/2016 0829   CHOLHDL 3 09/15/2016 0829   VLDL 14.4 09/15/2016 0829   LDLCALC 121 (H) 09/15/2016 0829    Physical Exam:    VS:  BP 132/78   Pulse 66   Ht 5\' 2"  (1.575 m)   Wt 133 lb 3.2 oz (60.4 kg)   BMI 24.36 kg/m     Wt Readings from Last 3 Encounters:  02/16/17 133 lb 3.2 oz (60.4 kg)  09/15/16 139 lb (63 kg)  12/23/15 133 lb 6.4 oz (60.5 kg)     GEN:  Well nourished, well developed in no acute distress HEENT: Normal NECK: No JVD; No carotid bruits LYMPHATICS: No lymphadenopathy CARDIAC: RRR, no murmurs, rubs, gallops RESPIRATORY:  Clear to auscultation without rales, wheezing or rhonchi  ABDOMEN: Soft, non-tender, non-distended MUSCULOSKELETAL:  No edema; No deformity  SKIN: Warm and dry NEUROLOGIC:   Alert and oriented x 3 PSYCHIATRIC:  Normal affect   ASSESSMENT:    1. Noncompaction cardiomyopathy (HCC)   2. Incomplete RBBB   3. Asymptomatic PVCs    PLAN:    In order of problems listed above:  1.  LV noncompaction - 2D echo last year showed normal LVF with no evidence of noncompaction.  She is asymptomatic.  I will repeat Holter monitor to evaluate for asymptomatic arrhythmias.    2.  Incomplete RBBB - stable  3.  Asymptomatic PVC's.  She denies any problems with palpitations or dizziness.    Medication Adjustments/Labs and Tests Ordered: Current medicines are reviewed at length with the patient today.  Concerns regarding medicines are outlined above.  No orders of the defined types were placed in this encounter.  No orders of the defined types were placed in this encounter.   Signed, Armanda Magic, MD  02/16/2017 11:24 AM    [facility]

## 2017-02-16 NOTE — Patient Instructions (Signed)
Medication Instructions:  Your physician recommends that you continue on your current medications as directed. Please refer to the Current Medication list given to you today.   Labwork: None ordered  Testing/Procedures: Your physician has requested that you have an echocardiogram. Echocardiography is a painless test that uses sound waves to create images of your heart. It provides your doctor with information about the size and shape of your heart and how well your heart's chambers and valves are working. This procedure takes approximately one hour. There are no restrictions for this procedure.  Your physician has recommended that you wear a holter monitor. Holter monitors are medical devices that record the heart's electrical activity. Doctors most often use these monitors to diagnose arrhythmias. Arrhythmias are problems with the speed or rhythm of the heartbeat. The monitor is a small, portable device. You can wear one while you do your normal daily activities. This is usually used to diagnose what is causing palpitations/syncope (passing out).  Follow-Up: Your physician wants you to follow-up in: 1 year with Dr. Mayford Knife. You will receive a reminder letter in the mail two months in advance. If you don't receive a letter, please call our office to schedule the follow-up appointment.   Any Other Special Instructions Will Be Listed Below (If Applicable).     If you need a refill on your cardiac medications before your next appointment, please call your pharmacy.

## 2017-02-25 ENCOUNTER — Ambulatory Visit (HOSPITAL_COMMUNITY): Payer: BLUE CROSS/BLUE SHIELD | Attending: Cardiovascular Disease

## 2017-02-25 ENCOUNTER — Other Ambulatory Visit: Payer: Self-pay | Admitting: Cardiology

## 2017-02-25 ENCOUNTER — Other Ambulatory Visit: Payer: Self-pay

## 2017-02-25 ENCOUNTER — Ambulatory Visit (INDEPENDENT_AMBULATORY_CARE_PROVIDER_SITE_OTHER): Payer: BLUE CROSS/BLUE SHIELD

## 2017-02-25 DIAGNOSIS — I428 Other cardiomyopathies: Secondary | ICD-10-CM

## 2017-02-25 DIAGNOSIS — I493 Ventricular premature depolarization: Secondary | ICD-10-CM

## 2017-02-25 DIAGNOSIS — E785 Hyperlipidemia, unspecified: Secondary | ICD-10-CM | POA: Insufficient documentation

## 2017-02-25 DIAGNOSIS — I4510 Unspecified right bundle-branch block: Secondary | ICD-10-CM | POA: Insufficient documentation

## 2017-02-25 DIAGNOSIS — I429 Cardiomyopathy, unspecified: Secondary | ICD-10-CM | POA: Insufficient documentation

## 2017-03-21 NOTE — Progress Notes (Signed)
HPI:  Emma Kim is a pleasant 61 y.o. here for follow up. Chronic medical problems summarized below were reviewed for changes. Reports  is working on eating a healthier diet and is getting regular exercise. Feels great. Denies CP, SOB, DOE, treatment intolerance or new symptoms. Due for flu shot She opted to wait for a physical for her lab rechecked  Hyperlipidemia/Prediabetes: -no meds -runner - active  Heart Murmur/Noncompaction cardiomyopathy/RBBB/Heart Palpitations: -seeing Dr. Mayford Knife in Cardiology -active: runner, exercise for 1 hour 5-6 days per week  Seasonal allergies: -uses flonase prn  ROS: See pertinent positives and negatives per HPI.  Past Medical History:  Diagnosis Date  . Abnormal EKG   . Asymptomatic PVCs   . Complication of anesthesia    when she woke up, "felt like she was coming through a fog"  . Distal radius fracture 06/13/2014  . 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 Mayford Knife  . PAC (premature atrial contraction)   . Vitamin D deficiency     Past Surgical History:  Procedure Laterality Date  . COLONOSCOPY    . TUBAL LIGATION      Family History  Problem Relation Age of Onset  . Hypertension Mother   . Hyperlipidemia Mother   . Hyperlipidemia Father   . Alcoholism Maternal Grandfather   . Alcoholism Maternal Grandmother     Social History   Socioeconomic History  . Marital status: Married    Spouse name: None  . Number of children: None  . Years of education: None  . Highest education level: None  Social Needs  . Financial resource strain: None  . Food insecurity - worry: None  . Food insecurity - inability: None  . Transportation needs - medical: None  . Transportation needs - non-medical: None  Occupational History  . None  Tobacco Use  . Smoking status: Never Smoker  . Smokeless tobacco: Never Used  Substance and Sexual Activity  . Alcohol use: Yes    Alcohol/week: 4.2 oz     Types: 7 Glasses of wine per week    Comment: 1 glass of win nightly  . Drug use: No  . Sexual activity: None  Other Topics Concern  . 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  cholecalciferol (VITAMIN D) 1000 UNITS tablet, Take 1,000 Units by mouth daily., Disp: , Rfl:  .  fluticasone (FLONASE) 50 MCG/ACT nasal spray, Place 1 spray into both nostrils daily., Disp: 16 g, Rfl: 6  EXAM:  Vitals:   03/22/17 0745  BP: 100/70  Pulse: 74  Temp: 98.5 F (36.9 C)    Body mass index is 25.11 kg/m.  GENERAL: vitals reviewed and listed above, alert, oriented, appears well hydrated and in no acute distress  HEENT: atraumatic, conjunttiva clear, no obvious abnormalities on inspection of external nose and ears  NECK: no obvious masses on inspection  LUNGS: clear to auscultation bilaterally, no wheezes, rales or rhonchi, good air movement  CV: HRRR, no peripheral edema  MS: moves all extremities without noticeable abnormality  PSYCH: pleasant and cooperative, no obvious depression or anxiety  ASSESSMENT AND PLAN:  Discussed the following assessment and plan:  Hyperlipidemia, unspecified hyperlipidemia type  Noncompaction cardiomyopathy (HCC)  Incomplete RBBB  PAC (premature atrial contraction)  BMI 25.0-25.9,adult  -Lifestyle recommendations, advised to continue  a healthy diet and regular exercise -congratulated on changes -She saw her cardiologist this year, follows up yearly -Flu shot today per her request, she wants to wait on her tetanus vaccine -Opted to check labs in May when she comes for her physical  -Follow up in the interim as needed  Patient Instructions  BEFORE YOU LEAVE: -Flu shot -follow up: CPE in May, come fasting   WE NOW  OFFER   [facility] FAST TRACK!!!  SAME DAY Appointments for ACUTE CARE  Such as: Sprains, Injuries, cuts, abrasions, rashes, muscle pain, joint pain, back pain Colds, flu, sore throats, headache, allergies, cough, fever  Ear pain, sinus and eye infections Abdominal pain, nausea, vomiting, diarrhea, upset stomach Animal/insect bites  3 Easy Ways to Schedule: Walk-In Scheduling Call in scheduling Mychart Sign-up: [email or web address]           Kriste BasqueKIM, Emberlee Sortino R., DO

## 2017-03-22 ENCOUNTER — Ambulatory Visit (INDEPENDENT_AMBULATORY_CARE_PROVIDER_SITE_OTHER): Payer: BLUE CROSS/BLUE SHIELD | Admitting: Family Medicine

## 2017-03-22 ENCOUNTER — Encounter: Payer: Self-pay | Admitting: Family Medicine

## 2017-03-22 VITALS — BP 100/70 | HR 74 | Temp 98.5°F | Ht 62.0 in | Wt 137.3 lb

## 2017-03-22 DIAGNOSIS — I491 Atrial premature depolarization: Secondary | ICD-10-CM

## 2017-03-22 DIAGNOSIS — I4510 Unspecified right bundle-branch block: Secondary | ICD-10-CM | POA: Diagnosis not present

## 2017-03-22 DIAGNOSIS — Z6825 Body mass index (BMI) 25.0-25.9, adult: Secondary | ICD-10-CM | POA: Diagnosis not present

## 2017-03-22 DIAGNOSIS — E785 Hyperlipidemia, unspecified: Secondary | ICD-10-CM

## 2017-03-22 DIAGNOSIS — Z23 Encounter for immunization: Secondary | ICD-10-CM

## 2017-03-22 DIAGNOSIS — I428 Other cardiomyopathies: Secondary | ICD-10-CM

## 2017-03-22 NOTE — Addendum Note (Signed)
Addended by: Johnella Moloney on: 03/22/2017 08:44 AM   Modules accepted: Orders

## 2017-03-22 NOTE — Patient Instructions (Addendum)
BEFORE YOU LEAVE: -Flu shot -follow up: CPE in May, come fasting   WE NOW OFFER   [facility] FAST TRACK!!!  SAME DAY Appointments for ACUTE CARE  Such as: Sprains, Injuries, cuts, abrasions, rashes, muscle pain, joint pain, back pain Colds, flu, sore throats, headache, allergies, cough, fever  Ear pain, sinus and eye infections Abdominal pain, nausea, vomiting, diarrhea, upset stomach Animal/insect bites  3 Easy Ways to Schedule: Walk-In Scheduling Call in scheduling Mychart Sign-up: [email or web address]

## 2017-05-27 ENCOUNTER — Encounter: Payer: Self-pay | Admitting: Family Medicine

## 2017-09-19 NOTE — Progress Notes (Signed)
HPI:  Using dictation device. Unfortunately this device frequently misinterprets words/phrases.  Here for CPE:  -Concerns and/or follow up today:  Due for mammogram and labs.  Chronic medical problems summarized below were reviewed for changes.  Reports she is eating healthy and getting regular exercise.  No complaints today.  She does have some questions regarding vaccines and brings her vaccines scheduled today.  She was born in [birth year] and got 1 measles vaccine in the early 12s.  She is not sure if this was the dead or live vaccine.  She does worry about her measles immunity status.  Her last tetanus booster was over 10 years ago.   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 Radford Pax in Cardiology -active: runner, exercises for 1 hour 5-6 days per week  Seasonal allergies: -uses flonase prn  -Taking folic acid, vitamin D or calcium: no -Diabetes and Dyslipidemia Screening: Fasting for lab work -Vaccines: see vaccine section EPIC -pap history:04/2015, hpv neg -FDLMP: see nursing notes -sexual activity: yes, female partner, no new partners -wants STI testing (Hep C if born [birth year]-65): no -FH breast, colon or ovarian ca: see FH Last mammogram: 2017, due for mammogram, she agrees to schedule this Last colon cancer screening: 09/2008 Breast Ca Risk Assessment: see family history and pt history DEXA (>/= 65): n/a  -Alcohol, Tobacco, drug use: see social history  Review of Systems - no reported fevers, unintentional weight loss, vision loss, hearing loss, chest pain, sob, hemoptysis, melena, hematochezia, hematuria, genital discharge, changing or concerning skin lesions, bleeding, bruising, loc, thoughts of self harm or SI  Past Medical History:  Diagnosis Date  . Abnormal EKG   . Asymptomatic PVCs   . Complication of anesthesia    when she woke up, "felt like she was coming through a fog"  . Distal radius  fracture 06/13/2014  . 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 Radford Pax  . PAC (premature atrial contraction)   . Vitamin D deficiency     Past Surgical History:  Procedure Laterality Date  . COLONOSCOPY    . OPEN REDUCTION INTERNAL FIXATION (ORIF) DISTAL RADIAL FRACTURE Right 06/13/2014   Procedure: OPEN REDUCTION INTERNAL FIXATION (ORIF) RIGHT DISTAL RADIAL FRACTURE;  Surgeon: Linna Hoff, MD;  Location: [facility];  Service: Orthopedics;  Laterality: Right;  . TUBAL LIGATION      Family History  Problem Relation Age of Onset  . Hypertension Mother   . Hyperlipidemia Mother   . Hyperlipidemia Father   . Alcoholism Maternal Grandfather   . Alcoholism Maternal Grandmother     Social History   Socioeconomic History  . Marital status: Married    Spouse name: Not on file  . Number of children: Not on file  . Years of education: Not on file  . Highest education level: Not on file  Occupational History  . Not on file  Social Needs  . Financial resource strain: Not on file  . Food insecurity:    Worry: Not on file    Inability: Not on file  . Transportation needs:    Medical: Not on file    Non-medical: Not on file  Tobacco Use  . Smoking status: Never Smoker  . Smokeless tobacco: Never Used  Substance and Sexual Activity  . Alcohol use: Yes    Alcohol/week: 4.2 oz    Types: 7 Glasses of wine per week    Comment: 1 glass of win nightly  .  Drug use: No  . Sexual activity: Not on file  Lifestyle  . Physical activity:    Days per week: Not on file    Minutes per session: Not on file  . Stress: Not on file  Relationships  . Social connections:    Talks on phone: Not on file    Gets together: Not on file    Attends religious service: Not on file    Active member of club or organization: Not on file    Attends meetings of clubs or organizations: Not on file    Relationship status: Not on file  Other Topics Concern  . 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  cholecalciferol (VITAMIN D) 1000 UNITS tablet, Take 1,000 Units by mouth daily., Disp: , Rfl:  .  fluticasone (FLONASE) 50 MCG/ACT nasal spray, Place 1 spray into both nostrils daily., Disp: 16 g, Rfl: 6  EXAM:  Vitals:   09/20/17 0808  BP: 90/70  Pulse: (!) 56  Temp: 98 F (36.7 C)    GENERAL: vitals reviewed and listed below, alert, oriented, appears well hydrated and in no acute distress  HEENT: head atraumatic, PERRLA, normal appearance of eyes, ears, nose and mouth. moist mucus membranes.  NECK: supple, no masses or lymphadenopathy  LUNGS: clear to auscultation bilaterally, no rales, rhonchi or wheeze  CV: HRRR, no peripheral edema or cyanosis, normal pedal pulses  ABDOMEN: bowel sounds normal, soft, non tender to palpation, no masses, no rebound or guarding  GU/BREAST: Deferred, she plans to schedule her breast center visit  SKIN: no rash or abnormal lesions,  Declines full skin check  MS: normal gait, moves all extremities normally  NEURO: normal gait, speech and thought processing grossly intact, muscle tone grossly intact throughout  PSYCH: normal affect, pleasant and cooperative  ASSESSMENT AND PLAN:  Discussed the following assessment and plan:  PREVENTIVE EXAM: -Discussed and advised all [place] preventive services health task force level A and B recommendations for age, sex and risks. -Advised at least 150 minutes of exercise per week and a healthy diet -labs, studies and vaccines per orders this encounter  2. Screening for depression Negative  3. Immunity status testing -For confirming measles immunity, discussed CDC guidelines she does fit into this subset requiring immunity testing, she opted to  get the MMR testing and the MMR vaccine if immunity is negative  4. 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  Patient advised to return to clinic immediately if symptoms worsen or persist or new concerns.  Patient Instructions  BEFORE YOU LEAVE: -Tetanus booster -Labs -follow up: Yearly for physical  Please call the breast center today to schedule your mammogram.  Please let us know if we can assist.  We have ordered labs or studies at this visit. It can take up to 1-2 weeks for results and processing. IF results require follow up or explanation, we will call you with instructions. Clinically stable results will be released to your [facility]. If you have not heard from [place] or cannot find your results in [facility] in 2  weeks please contact our office at [phone].  If you are not yet signed up for [facility], please consider signing up.   Preventive Care 40-64 Years, Female Preventive care refers to lifestyle choices and visits with your health care provider that can promote health and wellness. What does preventive care include?  A yearly physical exam. This is also called an annual well check.  Dental exams once or twice a year.  Routine eye exams. Ask your health care provider how often you should have your eyes checked.  Personal lifestyle choices, including: ? Daily care of your teeth and gums. ? Regular physical activity. ? Eating a healthy diet. ? Avoiding tobacco and drug use. ? Limiting alcohol use. ? Practicing safe sex. ? Taking low-dose aspirin daily starting at age 82. ? Taking vitamin and mineral supplements as recommended by your health care provider. What happens during an annual well check? The services and screenings done by your health care provider during your annual well check will depend on your age, overall health,  lifestyle risk factors, and family history of disease. Counseling Your health care provider may ask you questions about your:  Alcohol use.  Tobacco use.  Drug use.  Emotional well-being.  Home and relationship well-being.  Sexual activity.  Eating habits.  Work and work Statistician.  Method of birth control.  Menstrual cycle.  Pregnancy history.  Screening You may have the following tests or measurements:  Height, weight, and BMI.  Blood pressure.  Lipid and cholesterol levels. These may be checked every 5 years, or more frequently if you are over 35 years old.  Skin check.  Lung cancer screening. You may have this screening every year starting at age 97 if you have a 30-pack-year history of smoking and currently smoke or have quit within the past 15 years.  Fecal occult blood test (FOBT) of the stool. You may have this test every year starting at age 48.  Flexible sigmoidoscopy or colonoscopy. You may have a sigmoidoscopy every 5 years or a colonoscopy every 10 years starting at age 58.  Hepatitis C blood test.  Hepatitis B blood test.  Sexually transmitted disease (STD) testing.  Diabetes screening. This is done by checking your blood sugar (glucose) after you have not eaten for a while (fasting). You may have this done every 1-3 years.  Mammogram. This may be done every 1-2 years. Talk to your health care provider about when you should start having regular mammograms. This may depend on whether you have a family history of breast cancer.  BRCA-related cancer screening. This may be done if you have a family history of breast, ovarian, tubal, or peritoneal cancers.  Pelvic exam and Pap test. This may be done every 3 years starting at age 35. Starting at age 33, this may be done every 5 years if you have a Pap test in combination with an HPV test.  Bone density scan. This is done to screen for osteoporosis. You may have this scan if you are at high risk for  osteoporosis.  Discuss your test results, treatment options, and if necessary, the need for more tests with your health care provider. Vaccines Your health care provider may recommend certain vaccines, such as:  Influenza vaccine. This is recommended every year.  Tetanus, diphtheria, and acellular pertussis (Tdap, Td) vaccine. You may need a Td booster every 10 years.  Varicella vaccine. You may need this if you have not been vaccinated.  Zoster vaccine. You may need this  after age 15.  Measles, mumps, and rubella (MMR) vaccine. You may need at least one dose of MMR if you were born in 1957 or later. You may also need a second dose.  Pneumococcal 13-valent conjugate (PCV13) vaccine. You may need this if you have certain conditions and were not previously vaccinated.  Pneumococcal polysaccharide (PPSV23) vaccine. You may need one or two doses if you smoke cigarettes or if you have certain conditions.  Meningococcal vaccine. You may need this if you have certain conditions.  Hepatitis A vaccine. You may need this if you have certain conditions or if you travel or work in places where you may be exposed to hepatitis A.  Hepatitis B vaccine. You may need this if you have certain conditions or if you travel or work in places where you may be exposed to hepatitis B.  Haemophilus influenzae type b (Hib) vaccine. You may need this if you have certain conditions.  Talk to your health care provider about which screenings and vaccines you need and how often you need them. This information is not intended to replace advice given to you by your health care provider. Make sure you discuss any questions you have with your health care provider. Document Released: 05/31/2015 Document Revised: 01/22/2016 Document Reviewed: 03/05/2015 Elsevier Interactive Patient Education  2018 [employer].         No follow-ups on file.  Lucretia Kern, DO

## 2017-09-20 ENCOUNTER — Ambulatory Visit (INDEPENDENT_AMBULATORY_CARE_PROVIDER_SITE_OTHER): Payer: BLUE CROSS/BLUE SHIELD | Admitting: Family Medicine

## 2017-09-20 ENCOUNTER — Encounter: Payer: Self-pay | Admitting: Family Medicine

## 2017-09-20 ENCOUNTER — Other Ambulatory Visit: Payer: Self-pay | Admitting: Family Medicine

## 2017-09-20 VITALS — BP 90/70 | HR 56 | Temp 98.0°F | Ht 61.5 in | Wt 135.3 lb

## 2017-09-20 DIAGNOSIS — Z1231 Encounter for screening mammogram for malignant neoplasm of breast: Secondary | ICD-10-CM

## 2017-09-20 DIAGNOSIS — Z0000 Encounter for general adult medical examination without abnormal findings: Secondary | ICD-10-CM

## 2017-09-20 DIAGNOSIS — Z1331 Encounter for screening for depression: Secondary | ICD-10-CM

## 2017-09-20 DIAGNOSIS — I428 Other cardiomyopathies: Secondary | ICD-10-CM | POA: Diagnosis not present

## 2017-09-20 DIAGNOSIS — Z0184 Encounter for antibody response examination: Secondary | ICD-10-CM | POA: Diagnosis not present

## 2017-09-20 DIAGNOSIS — Z23 Encounter for immunization: Secondary | ICD-10-CM | POA: Diagnosis not present

## 2017-09-20 DIAGNOSIS — Z7185 Encounter for immunization safety counseling: Secondary | ICD-10-CM

## 2017-09-20 DIAGNOSIS — Z7189 Other specified counseling: Secondary | ICD-10-CM | POA: Diagnosis not present

## 2017-09-20 LAB — LIPID PANEL
CHOL/HDL RATIO: 3
CHOLESTEROL: 219 mg/dL — AB (ref 0–200)
HDL: 70.2 mg/dL (ref >39.00)
LDL CALC: 134 mg/dL — AB (ref 0–99)
NONHDL: 148.75
TRIGLYCERIDES: 73 mg/dL (ref 0.0–149.0)
VLDL: 14.6 mg/dL (ref 0.0–40.0)

## 2017-09-20 LAB — HEMOGLOBIN A1C: HEMOGLOBIN A1C: 5.7 % (ref 4.6–6.5)

## 2017-09-20 NOTE — Patient Instructions (Addendum)
BEFORE YOU LEAVE: -Tetanus booster -Labs -follow up: Yearly for physical  Please call the breast center today to schedule your mammogram.  Please let us know if we can assist.  We have ordered labs or studies at this visit. It can take up to 1-2 weeks for results and processing. IF results require follow up or explanation, we will call you with instructions. Clinically stable results will be released to your [facility] If you have not heard from [place] or cannot find your results in [facility] in 2 weeks please contact our office at [phone].  If you are not yet signed up for [facility], please consider signing up.   Preventive Care 40-64 Years, Female Preventive care refers to lifestyle choices and visits with your health care provider that can promote health and wellness. What does preventive care include?  A yearly physical exam. This is also called an annual well check.  Dental exams once or twice a year.  Routine eye exams. Ask your health care provider how often you should have your eyes checked.  Personal lifestyle choices, including: ? Daily care of your teeth and gums. ? Regular physical activity. ? Eating a healthy diet. ? Avoiding tobacco and drug use. ? Limiting alcohol use. ? Practicing safe sex. ? Taking low-dose aspirin daily starting at age 67. ? Taking vitamin and mineral supplements as recommended by your health care provider. What happens during an annual well check? The services and screenings done by your health care provider during your annual well check will depend on your age, overall health, lifestyle risk factors, and family history of disease. Counseling Your health care provider may ask you questions about your:  Alcohol use.  Tobacco use.  Drug use.  Emotional well-being.  Home and relationship well-being.  Sexual activity.  Eating habits.  Work and work Statistician.  Method of birth control.  Menstrual cycle.  Pregnancy  history.  Screening You may have the following tests or measurements:  Height, weight, and BMI.  Blood pressure.  Lipid and cholesterol levels. These may be checked every 5 years, or more frequently if you are over 37 years old.  Skin check.  Lung cancer screening. You may have this screening every year starting at age 66 if you have a 30-pack-year history of smoking and currently smoke or have quit within the past 15 years.  Fecal occult blood test (FOBT) of the stool. You may have this test every year starting at age 83.  Flexible sigmoidoscopy or colonoscopy. You may have a sigmoidoscopy every 5 years or a colonoscopy every 10 years starting at age 62.  Hepatitis C blood test.  Hepatitis B blood test.  Sexually transmitted disease (STD) testing.  Diabetes screening. This is done by checking your blood sugar (glucose) after you have not eaten for a while (fasting). You may have this done every 1-3 years.  Mammogram. This may be done every 1-2 years. Talk to your health care provider about when you should start having regular mammograms. This may depend on whether you have a family history of breast cancer.  BRCA-related cancer screening. This may be done if you have a family history of breast, ovarian, tubal, or peritoneal cancers.  Pelvic exam and Pap test. This may be done every 3 years starting at age 34. Starting at age 34, this may be done every 5 years if you have a Pap test in combination with an HPV test.  Bone density scan. This is done to screen for osteoporosis. You  may have this scan if you are at high risk for osteoporosis.  Discuss your test results, treatment options, and if necessary, the need for more tests with your health care provider. Vaccines Your health care provider may recommend certain vaccines, such as:  Influenza vaccine. This is recommended every year.  Tetanus, diphtheria, and acellular pertussis (Tdap, Td) vaccine. You may need a Td booster  every 10 years.  Varicella vaccine. You may need this if you have not been vaccinated.  Zoster vaccine. You may need this after age 60.  Measles, mumps, and rubella (MMR) vaccine. You may need at least one dose of MMR if you were born in 1957 or later. You may also need a second dose.  Pneumococcal 13-valent conjugate (PCV13) vaccine. You may need this if you have certain conditions and were not previously vaccinated.  Pneumococcal polysaccharide (PPSV23) vaccine. You may need one or two doses if you smoke cigarettes or if you have certain conditions.  Meningococcal vaccine. You may need this if you have certain conditions.  Hepatitis A vaccine. You may need this if you have certain conditions or if you travel or work in places where you may be exposed to hepatitis A.  Hepatitis B vaccine. You may need this if you have certain conditions or if you travel or work in places where you may be exposed to hepatitis B.  Haemophilus influenzae type b (Hib) vaccine. You may need this if you have certain conditions.  Talk to your health care provider about which screenings and vaccines you need and how often you need them. This information is not intended to replace advice given to you by your health care provider. Make sure you discuss any questions you have with your health care provider. Document Released: 05/31/2015 Document Revised: 01/22/2016 Document Reviewed: 03/05/2015 Elsevier Interactive Patient Education  [employer].

## 2017-09-20 NOTE — Addendum Note (Signed)
Addended by: Johnella Moloney on: 09/20/2017 09:06 AM   Modules accepted: Orders

## 2017-09-22 LAB — MEASLES/MUMPS/RUBELLA IMMUNITY
Mumps IgG: 22.3 AU/mL
RUBELLA: 4.06 {index}
RUBEOLA IGG: 216 [AU]/ml

## 2017-10-14 ENCOUNTER — Ambulatory Visit
Admission: RE | Admit: 2017-10-14 | Discharge: 2017-10-14 | Disposition: A | Discharge status: Home / Self Care | Payer: BLUE CROSS/BLUE SHIELD | Source: Ambulatory Visit | Attending: Family Medicine | Admitting: Family Medicine

## 2017-10-14 DIAGNOSIS — Z1231 Encounter for screening mammogram for malignant neoplasm of breast: Secondary | ICD-10-CM

## 2017-11-25 ENCOUNTER — Encounter: Payer: Self-pay | Admitting: Family Medicine

## 2017-11-25 ENCOUNTER — Ambulatory Visit (INDEPENDENT_AMBULATORY_CARE_PROVIDER_SITE_OTHER): Payer: BLUE CROSS/BLUE SHIELD | Admitting: Family Medicine

## 2017-11-25 VITALS — BP 100/64 | HR 66 | Temp 98.3°F | Ht 61.5 in | Wt 137.9 lb

## 2017-11-25 DIAGNOSIS — H6592 Unspecified nonsuppurative otitis media, left ear: Secondary | ICD-10-CM

## 2017-11-25 DIAGNOSIS — H6982 Other specified disorders of Eustachian tube, left ear: Secondary | ICD-10-CM

## 2017-11-25 DIAGNOSIS — H6123 Impacted cerumen, bilateral: Secondary | ICD-10-CM

## 2017-11-25 DIAGNOSIS — H6992 Unspecified Eustachian tube disorder, left ear: Secondary | ICD-10-CM

## 2017-11-25 NOTE — Progress Notes (Signed)
  HPI:  Using dictation device. Unfortunately this device frequently misinterprets words/phrases.  Acute visit for ear issues: -hx ear wax issues -fullness in ears x 3 weeks -went swimming once -no pain, fevers, drainage in ears -has some chronic sinus issues, has flonase but not using consistently -tried debrox  ROS: See pertinent positives and negatives per HPI.  Past Medical History:  Diagnosis Date  . Abnormal EKG   . Asymptomatic PVCs   . Complication of anesthesia    when she woke up, "felt like she was coming through a fog"  . Distal radius fracture 06/13/2014  . 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 Mayford Knife  . PAC (premature atrial contraction)   . Vitamin D deficiency     Past Surgical History:  Procedure Laterality Date  . COLONOSCOPY    . OPEN REDUCTION INTERNAL FIXATION (ORIF) DISTAL RADIAL FRACTURE Right 06/13/2014   Procedure: OPEN REDUCTION INTERNAL FIXATION (ORIF) RIGHT DISTAL RADIAL FRACTURE;  Surgeon: Sharma Covert, MD;  Location: [facility];  Service: Orthopedics;  Laterality: Right;  . TUBAL LIGATION      Family History  Problem Relation Age of Onset  . Hypertension Mother   . Hyperlipidemia Mother   . Hyperlipidemia Father   . Alcoholism Maternal Grandfather   . Alcoholism Maternal Grandmother     SOCIAL HX: see hpi   Current Outpatient Medications:  .  cholecalciferol (VITAMIN D) 1000 UNITS tablet, Take 1,000 Units by mouth daily., Disp: , Rfl:  .  fluticasone (FLONASE) 50 MCG/ACT nasal spray, Place 1 spray into both nostrils daily., Disp: 16 g, Rfl: 6  EXAM:  Vitals:   11/25/17 1303  BP: 100/64  Pulse: 66  Temp: 98.3 F (36.8 C)    Body mass index is 25.63 kg/m.  GENERAL: vitals reviewed and listed above, alert, oriented, appears well hydrated and in no acute distress  HEENT: atraumatic, conjunttiva clear, no obvious abnormalities on inspection of external nose and ears, cerumen impaction  bilaterally - normal apperance ear canals and TMs bilaterally after removal except for clear effusion R, nasal congestion clear, PND  NECK: no obvious masses on inspection  MS: moves all extremities without noticeable abnormality  PSYCH: pleasant and cooperative, no obvious depression or anxiety  ASSESSMENT AND PLAN:  Discussed the following assessment and plan:  Bilateral impacted cerumen -cerumen impaction - discussed options and treatment, wax removed with soft curette from R ear canal, lavage L - then removal remaining wax with soft current. Tolerated well. 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  Patient Instructions  flonase 2 sprays each nostril daily for 1 month, then 1 spray each nostril daily.  Debrox drops 4 days per months  Follow up 1 month if any symptoms persist. Sooner if any new concerns.   [name], DO

## 2017-11-25 NOTE — Patient Instructions (Addendum)
flonase 2 sprays each nostril daily for 1 month, then 1 spray each nostril daily.  Debrox drops 4 days per months  Follow up 1 month if any symptoms persist. Sooner if any new concerns.

## 2018-01-13 DIAGNOSIS — H5213 Myopia, bilateral: Secondary | ICD-10-CM | POA: Diagnosis not present

## 2018-02-10 ENCOUNTER — Ambulatory Visit (INDEPENDENT_AMBULATORY_CARE_PROVIDER_SITE_OTHER): Payer: BLUE CROSS/BLUE SHIELD

## 2018-02-10 DIAGNOSIS — Z23 Encounter for immunization: Secondary | ICD-10-CM | POA: Diagnosis not present

## 2018-02-16 ENCOUNTER — Other Ambulatory Visit: Payer: Self-pay | Admitting: *Deleted

## 2018-02-16 MED ORDER — FLUTICASONE PROPIONATE 50 MCG/ACT NA SUSP: 1.0000 | Freq: Every day | NASAL | 1 refills | Status: DC

## 2018-02-16 NOTE — Telephone Encounter (Signed)
Rx done. 

## 2018-03-20 ENCOUNTER — Other Ambulatory Visit: Payer: Self-pay | Admitting: Family Medicine

## 2018-05-23 NOTE — Progress Notes (Signed)
Cardiology Office Note:    Date:  05/24/2018   ID:  Emma Kim, DOB 1956/05/06, MRN [id number]  PCP:  [name], DO  Cardiologist:  Armanda Magic, MD    Referring MD: [name], DO   Chief Complaint  Patient presents with  . Follow-up    LV noncompaction    History of Present Illness:    Emma Kim is a 63 y.o. 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  She is here today for followup and is doing well.  She denies any chest pain or pressure, SOB, DOE, PND, orthopnea, LE edema, dizziness, palpitations or syncope. She is compliant with her meds and is tolerating meds with no SE.    Past Medical History:  Diagnosis Date  . Abnormal EKG   . Asymptomatic PVCs   . Complication of anesthesia    when she woke up, "felt like she was coming through a fog"  . Distal radius fracture 06/13/2014  . 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 Mayford Knife  . PAC (premature atrial contraction)   . Vitamin D deficiency     Past Surgical History:  Procedure Laterality Date  . COLONOSCOPY    . OPEN REDUCTION INTERNAL FIXATION (ORIF) DISTAL RADIAL FRACTURE Right 06/13/2014   Procedure: OPEN REDUCTION INTERNAL FIXATION (ORIF) RIGHT DISTAL RADIAL FRACTURE;  Surgeon: Sharma Covert, MD;  Location: [facility] OR;  Service: Orthopedics;  Laterality: Right;  . TUBAL LIGATION      Current Medications: Current Meds  Medication Sig  . Ascorbic Acid (VITAMIN C PO) Take 1-2 tablets by mouth daily.  . cholecalciferol (VITAMIN D) 1000 UNITS tablet Take 1,000 Units by mouth daily.  . fluticasone (FLONASE) 50 MCG/ACT nasal spray PLACE 1 SPRAY INTO BOTH NOSTRILS DAILY.     Allergies:   Bee pollen   Social History   Socioeconomic History  . Marital status: Married    Spouse name: Not on file  . Number of children: Not on file  . Years of education: Not on file  . Highest education level: Not on  file  Occupational History  . Not on file  Social Needs  . Financial resource strain: Not on file  . Food insecurity:    Worry: Not on file    Inability: Not on file  . Transportation needs:    Medical: Not on file    Non-medical: Not on file  Tobacco Use  . Smoking status: Never Smoker  . Smokeless tobacco: Never Used  Substance and Sexual Activity  . Alcohol use: Yes    Alcohol/week: 7.0 standard drinks    Types: 7 Glasses of wine per week    Comment: 1 glass of win nightly  . Drug use: No  . Sexual activity: Not on file  Lifestyle  . Physical activity:    Days per week: Not on file    Minutes per session: Not on file  . Stress: Not on file  Relationships  . Social connections:    Talks on phone: Not on file    Gets together: Not on file    Attends religious service: Not on file    Active member of club or organization: Not on file    Attends meetings of clubs or organizations: Not on file    Relationship status: Not on file  Other Topics Concern  . 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  ROS:   Please see the history of present illness.    ROS  All other systems reviewed and negative.   EKGs/Labs/Other Studies Reviewed:    The following studies were reviewed today: none  EKG:  EKG is  ordered today.  The ekg ordered today demonstrates NSR with nonspecific ST abnormality  Recent Labs: No results found for requested labs within last 8760 hours.   Recent Lipid Panel    Component Value Date/Time   CHOL 219 (H) 09/20/2017 0901   TRIG 73.0 09/20/2017 0901   HDL 70.20 09/20/2017  0901   CHOLHDL 3 09/20/2017 0901   VLDL 14.6 09/20/2017 0901   LDLCALC 134 (H) 09/20/2017 0901    Physical Exam:    VS:  BP 126/76   Pulse 63   Ht 5' 1.5" (1.562 m)   Wt 136 lb 6.4 oz (61.9 kg)   BMI 25.36 kg/m     Wt Readings from Last 3 Encounters:  05/24/18 136 lb 6.4 oz (61.9 kg)  11/25/17 137 lb 14.4 oz (62.6 kg)  09/20/17 135 lb 4.8 oz (61.4 kg)     GEN:  Well nourished, well developed in no acute distress HEENT: Normal NECK: No JVD; No carotid bruits LYMPHATICS: No lymphadenopathy CARDIAC: RRR, no murmurs, rubs, gallops RESPIRATORY:  Clear to auscultation without rales, wheezing or rhonchi  ABDOMEN: Soft, non-tender, non-distended MUSCULOSKELETAL:  No edema; No deformity  SKIN: Warm and dry NEUROLOGIC:  Alert and oriented x 3 PSYCHIATRIC:  Normal affect   ASSESSMENT:    1. Noncompaction cardiomyopathy (HCC)   2. Asymptomatic PVCs   3. PAC (premature atrial contraction)    PLAN:    In order of problems listed above:  1.  Noncompaction Cardiomyopathy - She is doing well.  She is asymptomatic.  Will repeat 2D echo to make sure LVF appears intact.  I will also get a 24 hr Holter to assess for arrhythmias.  2.  Asymptomatic PVCs - she remains asymptomatic with no palpitations.   3.  PAC's - she denies any palpitations.   Medication Adjustments/Labs and Tests Ordered: Current medicines are reviewed at length with the patient today.  Concerns regarding medicines are outlined above.  No orders of the defined types were placed in this encounter.  No orders of the defined types were placed in this encounter.   Signed, Armanda Magic, MD  05/24/2018 8:38 AM    [facility]

## 2018-05-24 ENCOUNTER — Ambulatory Visit (INDEPENDENT_AMBULATORY_CARE_PROVIDER_SITE_OTHER): Payer: BLUE CROSS/BLUE SHIELD | Admitting: Cardiology

## 2018-05-24 ENCOUNTER — Encounter: Payer: Self-pay | Admitting: Cardiology

## 2018-05-24 VITALS — BP 126/76 | HR 63 | Ht 61.5 in | Wt 136.4 lb

## 2018-05-24 DIAGNOSIS — I428 Other cardiomyopathies: Secondary | ICD-10-CM | POA: Diagnosis not present

## 2018-05-24 DIAGNOSIS — I493 Ventricular premature depolarization: Secondary | ICD-10-CM

## 2018-05-24 DIAGNOSIS — I491 Atrial premature depolarization: Secondary | ICD-10-CM | POA: Diagnosis not present

## 2018-05-24 NOTE — Patient Instructions (Signed)
Medication Instructions:  Your physician recommends that you continue on your current medications as directed. Please refer to the Current Medication list given to you today.  If you need a refill on your cardiac medications before your next appointment, please call your pharmacy.   Lab work:  If you have labs (blood work) drawn today and your tests are completely normal, you will receive your results only by: Marland Kitchen MyChart Message (if you have MyChart) OR . A paper copy in the mail If you have any lab test that is abnormal or we need to change your treatment, we will call you to review the results.  Testing/Procedures: Your physician has requested that you have an echocardiogram. Echocardiography is a painless test that uses sound waves to create images of your heart. It provides your doctor with information about the size and shape of your heart and how well your heart's chambers and valves are working. This procedure takes approximately one hour. There are no restrictions for this procedure.  Your physician has recommended that you wear a 24 Hour holter monitor. Holter monitors are medical devices that record the heart's electrical activity. Doctors most often use these monitors to diagnose arrhythmias. Arrhythmias are problems with the speed or rhythm of the heartbeat. The monitor is a small, portable device. You can wear one while you do your normal daily activities. This is usually used to diagnose what is causing palpitations/syncope (passing out).  Follow-Up: At [facility], you and your health needs are our priority.  As part of our continuing mission to provide you with exceptional heart care, we have created designated Provider Care Teams.  These Care Teams include your primary Cardiologist (physician) and Advanced Practice Providers (APPs -  Physician Assistants and Nurse Practitioners) who all work together to provide you with the care you need, when you need it. You will need a follow  up appointment in 1 years.  Please call our office 2 months in advance to schedule this appointment.  You may see Armanda Magic, MD or one of the following Advanced Practice Providers on your designated Care Team:   [place], PA-C Ronie Spies, PA-C . Jacolyn Reedy, PA-C

## 2018-06-13 ENCOUNTER — Ambulatory Visit (INDEPENDENT_AMBULATORY_CARE_PROVIDER_SITE_OTHER): Payer: BLUE CROSS/BLUE SHIELD

## 2018-06-13 ENCOUNTER — Ambulatory Visit (HOSPITAL_COMMUNITY): Payer: BLUE CROSS/BLUE SHIELD | Attending: Cardiology

## 2018-06-13 ENCOUNTER — Other Ambulatory Visit: Payer: Self-pay

## 2018-06-13 ENCOUNTER — Other Ambulatory Visit: Payer: Self-pay | Admitting: Cardiology

## 2018-06-13 DIAGNOSIS — I493 Ventricular premature depolarization: Secondary | ICD-10-CM

## 2018-06-13 DIAGNOSIS — I428 Other cardiomyopathies: Secondary | ICD-10-CM | POA: Insufficient documentation

## 2018-06-13 DIAGNOSIS — I491 Atrial premature depolarization: Secondary | ICD-10-CM

## 2018-06-13 MED ORDER — PERFLUTREN LIPID MICROSPHERE
1.0000 mL | INTRAVENOUS | Status: AC | PRN
Start: 2018-06-13 — End: 2018-06-13
  Administered 2018-06-13: 1 mL via INTRAVENOUS

## 2018-06-15 ENCOUNTER — Telehealth: Payer: Self-pay | Admitting: Cardiology

## 2018-06-15 NOTE — Telephone Encounter (Signed)
Spoke with the patient, she saw on her results that they said it was not suggestive for non-compaction. She was under the impression that she had non-compaction since she was young. She also wanted to know if she needs to worry about there tricuspid regurg and what the next steps are for her care. Sending to Dr. Mayford Knife.

## 2018-06-15 NOTE — Telephone Encounter (Signed)
Spoke with the patient, she expressed understanding about Dr. Norris Cross recommendations and had no further questions.

## 2018-06-15 NOTE — Telephone Encounter (Signed)
Please let her know that based on her cardiac MRI she does have non-compaction but it is mild.  MRI is a more sensitive test but with the echo all were looking for is a decline in heart function.  Her heart function is normal and we do not need to repeat an echo for about 3 years.  Please reassure her that almost everyone he gets an echo has least 1 valve that leaks a little and hers is mildly leaky in the tricuspid valve which is physiologic and nothing to be concerned of

## 2018-06-15 NOTE — Telephone Encounter (Signed)
New message ° ° °Patient is returning call for test results. °

## 2018-06-16 ENCOUNTER — Other Ambulatory Visit: Payer: Self-pay | Admitting: Family Medicine

## 2018-07-06 DIAGNOSIS — M25569 Pain in unspecified knee: Secondary | ICD-10-CM | POA: Diagnosis not present

## 2018-07-11 DIAGNOSIS — Z86018 Personal history of other benign neoplasm: Secondary | ICD-10-CM | POA: Diagnosis not present

## 2018-07-11 DIAGNOSIS — Z23 Encounter for immunization: Secondary | ICD-10-CM | POA: Diagnosis not present

## 2018-07-11 DIAGNOSIS — M25569 Pain in unspecified knee: Secondary | ICD-10-CM | POA: Diagnosis not present

## 2018-07-11 DIAGNOSIS — L814 Other melanin hyperpigmentation: Secondary | ICD-10-CM | POA: Diagnosis not present

## 2018-07-11 DIAGNOSIS — L821 Other seborrheic keratosis: Secondary | ICD-10-CM | POA: Diagnosis not present

## 2018-07-19 DIAGNOSIS — M25569 Pain in unspecified knee: Secondary | ICD-10-CM | POA: Diagnosis not present

## 2018-08-02 DIAGNOSIS — M25569 Pain in unspecified knee: Secondary | ICD-10-CM | POA: Diagnosis not present

## 2018-08-18 ENCOUNTER — Encounter: Payer: Self-pay | Admitting: Family Medicine

## 2018-08-25 NOTE — Telephone Encounter (Signed)
TOC has been scheduled.

## 2018-09-22 ENCOUNTER — Encounter: Payer: BLUE CROSS/BLUE SHIELD | Admitting: Family Medicine

## 2018-11-25 ENCOUNTER — Telehealth: Payer: Self-pay | Admitting: Family Medicine

## 2018-11-25 NOTE — Telephone Encounter (Signed)
Patient is calling to state to Emma Kim that she is fine with the virtual TOC appt with Dr. Raliegh Ip. Via email. Thank you

## 2018-11-27 ENCOUNTER — Encounter: Payer: Self-pay | Admitting: Family Medicine

## 2018-11-28 ENCOUNTER — Telehealth: Payer: Self-pay | Admitting: *Deleted

## 2018-11-28 ENCOUNTER — Other Ambulatory Visit: Payer: Self-pay

## 2018-11-28 ENCOUNTER — Encounter: Payer: Self-pay | Admitting: Family Medicine

## 2018-11-28 ENCOUNTER — Ambulatory Visit (INDEPENDENT_AMBULATORY_CARE_PROVIDER_SITE_OTHER): Payer: BC Managed Care – PPO | Admitting: Family Medicine

## 2018-11-28 DIAGNOSIS — E785 Hyperlipidemia, unspecified: Secondary | ICD-10-CM

## 2018-11-28 DIAGNOSIS — R739 Hyperglycemia, unspecified: Secondary | ICD-10-CM | POA: Diagnosis not present

## 2018-11-28 DIAGNOSIS — E559 Vitamin D deficiency, unspecified: Secondary | ICD-10-CM

## 2018-11-28 MED ORDER — FLUTICASONE PROPIONATE 50 MCG/ACT NA SUSP: 1.0000 | Freq: Every day | NASAL | 11 refills | Status: DC

## 2018-11-28 NOTE — Progress Notes (Signed)
Virtual Visit via Video Note  I connected with Emma Kim   on 11/28/18 at 11:30 AM EDT by a video enabled telemedicine application and verified that I am speaking with the correct person using two identifiers.  Location patient: home Location provider:work office Persons participating in the virtual visit: patient, provider  I discussed the limitations of evaluation and management by telemedicine and the availability of in person appointments. The patient expressed understanding and agreed to proceed.   Emma Kim DOB: 1956-01-03 Encounter date: 11/28/2018  This is a 63 y.o. female who presents to establish care. Chief Complaint  Patient presents with  . Establish Care    requests a refill on Flonase    History of present illness: Last CPE with [facility] was 09/2017. Would like to get bloodwork done in the fall when here for flu shot.   Would like to postpone preventative care until COVID has slowed down. Needs colonoscopy.   Uses deborx regularly for swimmers ear and does it preventatively. Hoping to avoid swimmers ear. Stays healthy. Gets regular exercise. Has been doing well with regular and increased exercise since coronavirus.   Has limited some sugar intake. And has lost a few pounds during COVID.   VQ:[id number] elevated lipids on bloodwork; diet and exercise controlled.   Hyperglycemia: slight on bloodwork. Diet and exercise controlled.   Palpitations, cardiomyopathy: follows with Dr. Mayford Knife. Had slight mumur noted in 2016; ended up with cardiology for eval. Feels well. States that exam has been stable.   Allergies: flonase prn. Well controlled with flonase.   Past Medical History:  Diagnosis Date  . Abnormal EKG   . Asymptomatic PVCs   . Complication of anesthesia    when she woke up, "felt like she was coming through a fog"  . Distal radius fracture 06/13/2014  . 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 Mayford Knife   . PAC (premature atrial contraction)   . Vitamin D deficiency    Past Surgical History:  Procedure Laterality Date  . COLONOSCOPY    . OPEN REDUCTION INTERNAL FIXATION (ORIF) DISTAL RADIAL FRACTURE Right 06/13/2014   Procedure: OPEN REDUCTION INTERNAL FIXATION (ORIF) RIGHT DISTAL RADIAL FRACTURE;  Surgeon: Sharma Covert, MD;  Location: [facility] OR;  Service: Orthopedics;  Laterality: Right;  . TUBAL LIGATION     Allergies  Allergen Reactions  . Bee Pollen Swelling   Current Meds  Medication Sig  . Ascorbic Acid (VITAMIN C PO) Take 1-2 tablets by mouth daily.  . cholecalciferol (VITAMIN D) 1000 UNITS tablet Take 1,000 Units by mouth daily.  . fluticasone (FLONASE) 50 MCG/ACT nasal spray Place 1 spray into both nostrils daily.  . [DISCONTINUED] fluticasone (FLONASE) 50 MCG/ACT nasal spray PLACE 1 SPRAY INTO BOTH NOSTRILS DAILY.   Social History   Tobacco Use  . Smoking status: Never Smoker  . Smokeless tobacco: Never Used  Substance Use Topics  . Alcohol use: Yes    Alcohol/week: 7.0 standard drinks    Types: 7 Glasses of wine per week    Comment: 1 glass of wine nightly or less   Family History  Problem Relation Age of Onset  . Hypertension Mother   . Hyperlipidemia Mother   . Dementia Mother        in hospice care  . Hyperlipidemia Father   . Deep vein thrombosis Brother   . Hypertension Brother   . Alcoholism Maternal Grandfather   . Alcoholism Maternal Grandmother  Review of Systems  Constitutional: Negative for chills, fatigue and fever.  Respiratory: Negative for cough, chest tightness, shortness of breath and wheezing.   Cardiovascular: Negative for chest pain, palpitations and leg swelling.    Objective:  There were no vitals taken for this visit.      BP Readings from Last 3 Encounters:  05/24/18 126/76  11/25/17 100/64  09/20/17 90/70   Wt Readings from Last 3 Encounters:  05/24/18 136 lb 6.4 oz (61.9 kg)  11/25/17 137 lb 14.4 oz (62.6 kg)   09/20/17 135 lb 4.8 oz (61.4 kg)    EXAM:  GENERAL: alert, oriented, appears well and in no acute distress  HEENT: atraumatic, conjunctiva clear, no obvious abnormalities on inspection of external nose and ears  NECK: normal movements of the head and neck  LUNGS: on inspection no signs of respiratory distress, breathing rate appears normal, no obvious gross SOB, gasping or wheezing  CV: no obvious cyanosis  MS: moves all visible extremities without noticeable abnormality  PSYCH/NEURO: pleasant and cooperative, no obvious depression or anxiety, speech and thought processing grossly intact  Assessment/Plan  1. Hyperlipidemia, unspecified hyperlipidemia type Will plan to recheck bloodwork.   2. Hyperglycemia Will check A1C. Discussed slightly elevated sugar.  Gust importance of regular exercise and limiting carbohydrates in diet.  3.  Vitamin D deficiency: We will check blood work.   Return in about 6 months (around 05/31/2019) for physical exam.   I discussed the assessment and treatment plan with the patient. The patient was provided an opportunity to ask questions and all were answered. The patient agreed with the plan and demonstrated an understanding of the instructions.   The patient was advised to call back or seek an in-person evaluation if the symptoms worsen or if the condition fails to improve as anticipated.  I provided 22 minutes of non-face-to-face time during this encounter.   Micheline Rough, MD

## 2018-11-28 NOTE — Telephone Encounter (Signed)
I called the pt and informed her the total cost for a Tdap is $121.  Lab appt scheduled for 9/23 and I was unable to schedule a nurses visit for the same day.  I asked that the pt call back for a flu shot and she agreed.

## 2018-11-28 NOTE — Telephone Encounter (Signed)
Doxy sent via email.

## 2018-11-28 NOTE — Telephone Encounter (Signed)
-----   Message from Caren Macadam, MD sent at 11/28/2018 12:09 PM EDT ----- Can you tell her cost of Tdap when you call her? Schedule bloodwork in fall and flu shot appointment at same time.

## 2018-12-13 ENCOUNTER — Encounter: Payer: Self-pay | Admitting: Family Medicine

## 2019-02-08 ENCOUNTER — Other Ambulatory Visit: Payer: Self-pay

## 2019-02-13 ENCOUNTER — Other Ambulatory Visit: Payer: Self-pay

## 2019-02-13 ENCOUNTER — Other Ambulatory Visit (INDEPENDENT_AMBULATORY_CARE_PROVIDER_SITE_OTHER): Payer: BC Managed Care – PPO

## 2019-02-13 DIAGNOSIS — E785 Hyperlipidemia, unspecified: Secondary | ICD-10-CM | POA: Diagnosis not present

## 2019-02-13 DIAGNOSIS — R739 Hyperglycemia, unspecified: Secondary | ICD-10-CM | POA: Diagnosis not present

## 2019-02-13 DIAGNOSIS — Z23 Encounter for immunization: Secondary | ICD-10-CM

## 2019-02-13 DIAGNOSIS — E559 Vitamin D deficiency, unspecified: Secondary | ICD-10-CM | POA: Diagnosis not present

## 2019-02-13 LAB — LIPID PANEL
Cholesterol: 197 mg/dL (ref 0–200)
HDL: 69.8 mg/dL (ref >39.00)
LDL Cholesterol: 111 mg/dL — ABNORMAL HIGH (ref 0–99)
NonHDL: 127.3
Total CHOL/HDL Ratio: 3
Triglycerides: 82 mg/dL (ref 0.0–149.0)
VLDL: 16.4 mg/dL (ref 0.0–40.0)

## 2019-02-13 LAB — COMPREHENSIVE METABOLIC PANEL
ALT: 10 U/L (ref 0–35)
AST: 14 U/L (ref 0–37)
Albumin: 4.3 g/dL (ref 3.5–5.2)
Alkaline Phosphatase: 67 U/L (ref 39–117)
BUN: 16 mg/dL (ref 6–23)
CO2: 30 mEq/L (ref 19–32)
Calcium: 9.9 mg/dL (ref 8.4–10.5)
Chloride: 103 mEq/L (ref 96–112)
Creatinine, Ser: 0.76 mg/dL (ref 0.40–1.20)
GFR: 77.15 mL/min (ref >60.00)
Glucose, Bld: 95 mg/dL (ref 70–99)
Potassium: 3.6 mEq/L (ref 3.5–5.1)
Sodium: 143 mEq/L (ref 135–145)
Total Bilirubin: 0.6 mg/dL (ref 0.2–1.2)
Total Protein: 7.2 g/dL (ref 6.0–8.3)

## 2019-02-13 LAB — HEMOGLOBIN A1C: Hgb A1c MFr Bld: 5.9 % (ref 4.6–6.5)

## 2019-02-13 LAB — VITAMIN D 25 HYDROXY (VIT D DEFICIENCY, FRACTURES): VITD: 31.92 ng/mL (ref 30.00–100.00)

## 2019-03-07 ENCOUNTER — Other Ambulatory Visit: Payer: Self-pay | Admitting: Family Medicine

## 2019-03-07 DIAGNOSIS — Z1231 Encounter for screening mammogram for malignant neoplasm of breast: Secondary | ICD-10-CM

## 2019-03-08 DIAGNOSIS — H5213 Myopia, bilateral: Secondary | ICD-10-CM | POA: Diagnosis not present

## 2019-04-26 ENCOUNTER — Other Ambulatory Visit: Payer: Self-pay

## 2019-04-26 ENCOUNTER — Ambulatory Visit
Admission: RE | Admit: 2019-04-26 | Discharge: 2019-04-26 | Disposition: A | Discharge status: Home / Self Care | Payer: BC Managed Care – PPO | Source: Ambulatory Visit | Attending: Family Medicine | Admitting: Family Medicine

## 2019-04-26 DIAGNOSIS — Z1231 Encounter for screening mammogram for malignant neoplasm of breast: Secondary | ICD-10-CM | POA: Diagnosis not present

## 2019-06-15 ENCOUNTER — Other Ambulatory Visit: Payer: Self-pay

## 2019-06-16 ENCOUNTER — Encounter: Payer: Self-pay | Admitting: Family Medicine

## 2019-06-16 ENCOUNTER — Ambulatory Visit (INDEPENDENT_AMBULATORY_CARE_PROVIDER_SITE_OTHER): Payer: BC Managed Care – PPO | Admitting: Family Medicine

## 2019-06-16 ENCOUNTER — Other Ambulatory Visit (HOSPITAL_COMMUNITY)
Admission: RE | Admit: 2019-06-16 | Discharge: 2019-06-16 | Disposition: A | Discharge status: Home / Self Care | Payer: BC Managed Care – PPO | Source: Ambulatory Visit | Attending: Family Medicine | Admitting: Family Medicine

## 2019-06-16 VITALS — BP 118/64 | HR 62 | Temp 98.0°F | Ht 62.0 in | Wt 134.3 lb

## 2019-06-16 DIAGNOSIS — Z0000 Encounter for general adult medical examination without abnormal findings: Secondary | ICD-10-CM | POA: Diagnosis not present

## 2019-06-16 DIAGNOSIS — Z23 Encounter for immunization: Secondary | ICD-10-CM | POA: Diagnosis not present

## 2019-06-16 DIAGNOSIS — J302 Other seasonal allergic rhinitis: Secondary | ICD-10-CM

## 2019-06-16 DIAGNOSIS — Z124 Encounter for screening for malignant neoplasm of cervix: Secondary | ICD-10-CM | POA: Insufficient documentation

## 2019-06-16 DIAGNOSIS — R3129 Other microscopic hematuria: Secondary | ICD-10-CM

## 2019-06-16 DIAGNOSIS — R350 Frequency of micturition: Secondary | ICD-10-CM

## 2019-06-16 LAB — POCT URINALYSIS DIPSTICK
Bilirubin, UA: NEGATIVE
Blood, UA: 1
Glucose, UA: NEGATIVE
Ketones, UA: NEGATIVE
Leukocytes, UA: NEGATIVE
Nitrite, UA: NEGATIVE
Protein, UA: NEGATIVE
Spec Grav, UA: 1.015 (ref 1.010–1.025)
Urobilinogen, UA: 0.2 E.U./dL
pH, UA: 6 (ref 5.0–8.0)

## 2019-06-16 MED ORDER — FLUTICASONE PROPIONATE 50 MCG/ACT NA SUSP: 1.0000 | Freq: Every day | NASAL | 11 refills | Status: AC

## 2019-06-16 NOTE — Progress Notes (Signed)
Emma Kim DOB: [birth year] Encounter date: 06/16/2019  This is a 64 y.o. female who presents for complete physical   History of present illness/Additional concerns:  Has limited some sugar intake. And has lost a few pounds during COVID. Has continued to do this.   AS:[id number] elevated lipids on bloodwork; diet and exercise controlled.   Hyperglycemia: slight on bloodwork. Diet and exercise controlled.   Palpitations, cardiomyopathy: follows with Dr. Mayford Knife. Had slight mumur noted in 2016; ended up with cardiology for eval. Last visit was Jan 2020 and is due to go back, but she was told she didn't need to do another echo for 3 years. Hasn't had any palpitations recently.  Allergies: flonase prn. Well controlled with flonase  Follows with dermatology (Dr. Sharyn Lull)  Last mammogram was December/2020.  Negative.  Repeat in 1 year.  Due for repeat colonoscopy in 2020, but wants to delay due to COVID.   Wants to get Tdap because she is expecting grandchild.  Last Tdap was over 12 years ago.  For last month on and off feels like she has UTI. Sometimes burning; sx on and off. No blood in urine, no odor. Sometimes increased frequency. No leaking.    Past Medical History:  Diagnosis Date  . Abnormal EKG   . Asymptomatic PVCs   . Complication of anesthesia    when she woke up, "felt like she was coming through a fog"  . Distal radius fracture 06/13/2014  . 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 Mayford Knife  . PAC (premature atrial contraction)   . Vitamin D deficiency    Past Surgical History:  Procedure Laterality Date  . COLONOSCOPY    . OPEN REDUCTION INTERNAL FIXATION (ORIF) DISTAL RADIAL FRACTURE Right 06/13/2014   Procedure: OPEN REDUCTION INTERNAL FIXATION (ORIF) RIGHT DISTAL RADIAL FRACTURE;  Surgeon: Sharma Covert, MD;  Location: [facility] OR;  Service: Orthopedics;  Laterality: Right;  . TUBAL LIGATION     Allergies   Allergen Reactions  . Bee Pollen Swelling   Current Meds  Medication Sig  . Ascorbic Acid (VITAMIN C PO) Take 1-2 tablets by mouth daily.  . cholecalciferol (VITAMIN D) 1000 UNITS tablet Take 1,000 Units by mouth daily.  . ferrous sulfate 325 (65 FE) MG tablet Take 325 mg by mouth daily with breakfast.  . fluticasone (FLONASE) 50 MCG/ACT nasal spray Place 1 spray into both nostrils daily.  . [DISCONTINUED] fluticasone (FLONASE) 50 MCG/ACT nasal spray Place 1 spray into both nostrils daily.   Social History   Tobacco Use  . Smoking status: Never Smoker  . Smokeless tobacco: Never Used  Substance Use Topics  . Alcohol use: Yes    Alcohol/week: 7.0 standard drinks    Types: 7 Glasses of wine per week    Comment: 1 glass of wine nightly or less   Family History  Problem Relation Age of Onset  . Hypertension Mother   . Hyperlipidemia Mother   . Dementia Mother        in hospice care  . Hyperlipidemia Father   . Deep vein thrombosis Brother   . Hypertension Brother   . Alcoholism Maternal Grandfather   . Alcoholism Maternal Grandmother      Review of Systems  Constitutional: Negative for activity change, appetite change, chills, fatigue, fever and unexpected weight change.  HENT: Negative for congestion, ear pain, hearing loss, sinus pressure, sinus pain, sore throat and trouble swallowing.   Eyes: Negative  for pain and visual disturbance.  Respiratory: Negative for cough, chest tightness, shortness of breath and wheezing.   Cardiovascular: Negative for chest pain, palpitations and leg swelling.  Gastrointestinal: Negative for abdominal pain, blood in stool, constipation, diarrhea, nausea and vomiting.  Genitourinary: Negative for difficulty urinating and menstrual problem.  Musculoskeletal: Negative for arthralgias and back pain.  Skin: Negative for rash.  Neurological: Negative for dizziness, weakness, numbness and headaches.  Hematological: Negative for adenopathy. Does  not bruise/bleed easily.  Psychiatric/Behavioral: Negative for sleep disturbance and suicidal ideas. The patient is not nervous/anxious.     CBC:  Lab Results  Component Value Date   WBC 6.3 06/13/2014   HGB 12.8 06/13/2014   HCT 38.7 06/13/2014   MCH 28.4 06/13/2014   MCHC 33.1 06/13/2014   RDW 12.6 06/13/2014   PLT 192 06/13/2014   CMP: Lab Results  Component Value Date   NA 143 02/13/2019   K 3.6 02/13/2019   CL 103 02/13/2019   CO2 30 02/13/2019   ANIONGAP 7 06/13/2014   GLUCOSE 95 02/13/2019   BUN 16 02/13/2019   CREATININE 0.76 02/13/2019   GFRAA 88 (L) 06/13/2014   CALCIUM 9.9 02/13/2019   PROT 7.2 02/13/2019   BILITOT 0.6 02/13/2019   ALKPHOS 67 02/13/2019   ALT 10 02/13/2019   AST 14 02/13/2019   LIPID: Lab Results  Component Value Date   CHOL 197 02/13/2019   TRIG 82.0 02/13/2019   HDL 69.80 02/13/2019   LDLCALC 111 (H) 02/13/2019    Objective:  BP 118/64 (BP Location: Right Arm, Patient Position: Sitting, Cuff Size: Normal)   Pulse 62   Temp 98 F (36.7 C) (Temporal)   Ht 5\' 2"  (1.575 m)   Wt 134 lb 4.8 oz (60.9 kg)   SpO2 97%   BMI 24.56 kg/m   Weight: 134 lb 4.8 oz (60.9 kg)   BP Readings from Last 3 Encounters:  06/16/19 118/64  05/24/18 126/76  11/25/17 100/64   Wt Readings from Last 3 Encounters:  06/16/19 134 lb 4.8 oz (60.9 kg)  05/24/18 136 lb 6.4 oz (61.9 kg)  11/25/17 137 lb 14.4 oz (62.6 kg)    Physical Exam Constitutional:      General: She is not in acute distress.    Appearance: She is well-developed.  HENT:     Head: Normocephalic and atraumatic.     Right Ear: External ear normal.     Left Ear: External ear normal.     Mouth/Throat:     Pharynx: No oropharyngeal exudate.  Eyes:     Conjunctiva/sclera: Conjunctivae normal.     Pupils: Pupils are equal, round, and reactive to light.  Neck:     Thyroid: No thyromegaly.  Cardiovascular:     Rate and Rhythm: Normal rate and regular rhythm.     Heart sounds:  Normal heart sounds. No murmur. No friction rub. No gallop.   Pulmonary:     Effort: Pulmonary effort is normal.     Breath sounds: Normal breath sounds.  Abdominal:     General: Bowel sounds are normal. There is no distension.     Palpations: Abdomen is soft. There is no mass.     Tenderness: There is no abdominal tenderness. There is no guarding.     Hernia: No hernia is present.  Musculoskeletal:        General: No tenderness or deformity. Normal range of motion.     Cervical back: Normal range of motion and neck  supple.  Lymphadenopathy:     Cervical: No cervical adenopathy.  Skin:    General: Skin is warm and dry.     Findings: No rash.  Neurological:     Mental Status: She is alert and oriented to person, place, and time.     Deep Tendon Reflexes: Reflexes normal.     Reflex Scores:      Tricep reflexes are 2+ on the right side and 2+ on the left side.      Bicep reflexes are 2+ on the right side and 2+ on the left side.      Brachioradialis reflexes are 2+ on the right side and 2+ on the left side.      Patellar reflexes are 2+ on the right side and 2+ on the left side. Psychiatric:        Speech: Speech normal.        Behavior: Behavior normal.        Thought Content: Thought content normal.     Assessment/Plan: Health Maintenance Due  Topic Date Due  . PAP SMEAR-Modifier  04/30/2018  . COLONOSCOPY  10/02/2018   Health Maintenance reviewed -would like to delay colonoscopy follow-up due to Covid..  1. Preventative health care Keep up with exercise and healthy eating.  2. Need for Tdap vaccination Completed today. - Tdap vaccine greater than or equal to 7yo IM  3. Urinary frequency Microscopic hematuria.  Will need further evaluation, but lab is closed for the weekend.  We will have her drop off urine for further evaluation on Monday. - POC Urinalysis Dipstick  4. Seasonal allergies - fluticasone (FLONASE) 50 MCG/ACT nasal spray; Place 1 spray into both  nostrils daily.  Dispense: 48 g; Refill: 11  5. Screening for cervical cancer - PAP [[place]]  6. Other microscopic hematuria - Culture, Urine; Future - Cytology, Non-Gyn [[place]]; Future  Return for drop off urine on monday for culture/testing.  Theodis Shove, MD

## 2019-06-19 ENCOUNTER — Other Ambulatory Visit (HOSPITAL_COMMUNITY)
Admission: RE | Admit: 2019-06-19 | Discharge: 2019-06-19 | Disposition: A | Discharge status: Home / Self Care | Payer: BC Managed Care – PPO | Source: Ambulatory Visit | Attending: Family Medicine | Admitting: Family Medicine

## 2019-06-19 DIAGNOSIS — R3129 Other microscopic hematuria: Secondary | ICD-10-CM | POA: Diagnosis not present

## 2019-06-19 DIAGNOSIS — R8289 Other abnormal findings on cytological and histological examination of urine: Secondary | ICD-10-CM | POA: Diagnosis not present

## 2019-06-19 NOTE — Addendum Note (Signed)
Addended by: Philemon Kingdom on: 06/19/2019 11:20 AM   Modules accepted: Orders

## 2019-06-19 NOTE — Addendum Note (Signed)
Addended by: Philemon Kingdom on: 06/19/2019 11:17 AM   Modules accepted: Orders

## 2019-06-21 LAB — CYTOLOGY - PAP
Comment: NEGATIVE
Diagnosis: NEGATIVE
High risk HPV: NEGATIVE

## 2019-06-21 LAB — URINE CULTURE
MICRO NUMBER:: 10102193
Result:: NO GROWTH
SPECIMEN QUALITY:: ADEQUATE

## 2019-06-22 LAB — CYTOLOGY - NON PAP

## 2019-06-27 NOTE — Addendum Note (Signed)
Addended by: Johnella Moloney on: 06/27/2019 09:02 AM   Modules accepted: Orders

## 2019-07-07 ENCOUNTER — Other Ambulatory Visit: Payer: Self-pay

## 2019-07-10 ENCOUNTER — Ambulatory Visit (INDEPENDENT_AMBULATORY_CARE_PROVIDER_SITE_OTHER): Payer: BC Managed Care – PPO

## 2019-07-10 ENCOUNTER — Other Ambulatory Visit: Payer: Self-pay

## 2019-07-10 DIAGNOSIS — Z23 Encounter for immunization: Secondary | ICD-10-CM | POA: Diagnosis not present

## 2019-07-10 NOTE — Progress Notes (Signed)
Per orders of Dr. Hassan Rowan, injection of first Shingrix vaccine in right  given by Nolon Lennert. RN  Patient tolerated injection well.

## 2019-07-13 DIAGNOSIS — Z86018 Personal history of other benign neoplasm: Secondary | ICD-10-CM | POA: Diagnosis not present

## 2019-07-13 DIAGNOSIS — D225 Melanocytic nevi of trunk: Secondary | ICD-10-CM | POA: Diagnosis not present

## 2019-07-13 DIAGNOSIS — L821 Other seborrheic keratosis: Secondary | ICD-10-CM | POA: Diagnosis not present

## 2019-07-13 DIAGNOSIS — L814 Other melanin hyperpigmentation: Secondary | ICD-10-CM | POA: Diagnosis not present

## 2019-07-14 DIAGNOSIS — R8279 Other abnormal findings on microbiological examination of urine: Secondary | ICD-10-CM | POA: Diagnosis not present

## 2019-07-14 DIAGNOSIS — R350 Frequency of micturition: Secondary | ICD-10-CM | POA: Diagnosis not present

## 2019-08-01 DIAGNOSIS — R350 Frequency of micturition: Secondary | ICD-10-CM | POA: Diagnosis not present

## 2019-08-01 DIAGNOSIS — R3121 Asymptomatic microscopic hematuria: Secondary | ICD-10-CM | POA: Diagnosis not present

## 2019-08-01 DIAGNOSIS — R8279 Other abnormal findings on microbiological examination of urine: Secondary | ICD-10-CM | POA: Diagnosis not present

## 2019-08-07 DIAGNOSIS — R3121 Asymptomatic microscopic hematuria: Secondary | ICD-10-CM | POA: Diagnosis not present

## 2019-08-07 DIAGNOSIS — R3129 Other microscopic hematuria: Secondary | ICD-10-CM | POA: Diagnosis not present

## 2019-09-26 ENCOUNTER — Ambulatory Visit: Payer: BC Managed Care – PPO | Admitting: Cardiology

## 2019-10-06 DIAGNOSIS — Z1331 Encounter for screening for depression: Secondary | ICD-10-CM | POA: Diagnosis not present

## 2019-10-06 DIAGNOSIS — Z13820 Encounter for screening for osteoporosis: Secondary | ICD-10-CM | POA: Diagnosis not present

## 2019-10-06 DIAGNOSIS — R3121 Asymptomatic microscopic hematuria: Secondary | ICD-10-CM | POA: Diagnosis not present

## 2019-10-06 DIAGNOSIS — J309 Allergic rhinitis, unspecified: Secondary | ICD-10-CM | POA: Diagnosis not present

## 2019-10-10 ENCOUNTER — Ambulatory Visit: Payer: BC Managed Care – PPO | Admitting: Cardiology

## 2019-10-11 ENCOUNTER — Other Ambulatory Visit: Payer: Self-pay | Admitting: Internal Medicine

## 2019-10-11 DIAGNOSIS — Z13820 Encounter for screening for osteoporosis: Secondary | ICD-10-CM

## 2019-10-23 ENCOUNTER — Other Ambulatory Visit: Payer: Self-pay | Admitting: Internal Medicine

## 2019-10-23 DIAGNOSIS — Z1231 Encounter for screening mammogram for malignant neoplasm of breast: Secondary | ICD-10-CM

## 2019-12-28 ENCOUNTER — Other Ambulatory Visit: Payer: Self-pay

## 2019-12-28 ENCOUNTER — Encounter: Payer: Self-pay | Admitting: Cardiology

## 2019-12-28 ENCOUNTER — Encounter: Payer: Self-pay | Admitting: *Deleted

## 2019-12-28 ENCOUNTER — Ambulatory Visit (INDEPENDENT_AMBULATORY_CARE_PROVIDER_SITE_OTHER): Payer: BC Managed Care – PPO | Admitting: Cardiology

## 2019-12-28 VITALS — BP 144/86 | HR 57 | Ht 62.0 in | Wt 133.2 lb

## 2019-12-28 DIAGNOSIS — I493 Ventricular premature depolarization: Secondary | ICD-10-CM

## 2019-12-28 DIAGNOSIS — I491 Atrial premature depolarization: Secondary | ICD-10-CM

## 2019-12-28 DIAGNOSIS — I428 Other cardiomyopathies: Secondary | ICD-10-CM

## 2019-12-28 NOTE — Progress Notes (Addendum)
Patient ID: Emma Kim, female   DOB: 1956-04-21, 64 y.o.   MRN: [id number] Patient enrolled for [employer] to ship a 3 day ZIO XT long term holter monitor to her home on 01/17/2020.

## 2019-12-28 NOTE — Patient Instructions (Signed)
Medication Instructions:  Your physician recommends that you continue on your current medications as directed. Please refer to the Current Medication list given to you today.  *If you need a refill on your cardiac medications before your next appointment, please call your pharmacy*   Testing/Procedures: Your physician has recommended that you wear an event monitor. Event monitors are medical devices that record the heart's electrical activity. Doctors most often [place] these monitors to diagnose arrhythmias. Arrhythmias are problems with the speed or rhythm of the heartbeat. The monitor is a small, portable device. You can wear one while you do your normal daily activities. This is usually used to diagnose what is causing palpitations/syncope (passing out).  Follow-Up: At [facility], you and your health needs are our priority.  As part of our continuing mission to provide you with exceptional heart care, we have created designated Provider Care Teams.  These Care Teams include your primary Cardiologist (physician) and Advanced Practice Providers (APPs -  Physician Assistants and Nurse Practitioners) who all work together to provide you with the care you need, when you need it.  Your next appointment:   1 year(s)  The format for your next appointment:   In Person  Provider:   You may see Armanda Magic, MD or one of the following Advanced Practice Providers on your designated Care Team:    Ronie Spies, PA-C  Jacolyn Reedy, PA-C   Other Instructions:  Christena Deem- Long Term Monitor Instructions   Your physician has requested you wear your ZIO patch monitor 3 days.   This is a single patch monitor.  [employer] supplies one patch monitor per enrollment.  Additional stickers are not available.   Please do not apply patch if you will be having a Nuclear Stress Test, Echocardiogram, Cardiac CT, MRI, or Chest Xray during the time frame you would be wearing the monitor. The patch cannot be worn during  these tests.  You cannot remove and re-apply the ZIO XT patch monitor.   Your ZIO patch monitor will be sent USPS Priority mail from [facility] directly to your home address. The monitor may also be mailed to a PO BOX if home delivery is not available.   It may take 3-5 days to receive your monitor after you have been enrolled.   Once you have received you monitor, please review enclosed instructions.  Your monitor has already been registered assigning a specific monitor serial # to you.   Applying the monitor   Shave hair from upper left chest.   Hold abrader disc by orange tab.  Rub abrader in 40 strokes over left upper chest as indicated in your monitor instructions.   Clean area with 4 enclosed alcohol pads .  Use all pads to assure are is cleaned thoroughly.  Let dry.   Apply patch as indicated in monitor instructions.  Patch will be place under collarbone on left side of chest with arrow pointing upward.   Rub patch adhesive wings for 2 minutes.Remove white label marked "1".  Remove white label marked "2".  Rub patch adhesive wings for 2 additional minutes.   While looking in a mirror, press and release button in center of patch.  A small green light will flash 3-4 times .  This will be your only indicator the monitor has been turned on.     Do not shower for the first 24 hours.  You may shower after the first 24 hours.   Press button if you feel a  symptom. You will hear a small click.  Record Date, Time and Symptom in the Patient Log Book.   When you are ready to remove patch, follow instructions on last 2 pages of Patient Log Book.  Stick patch monitor onto last page of Patient Log Book.   Place Patient Log Book in [place] box.  Use locking tab on box and tape box closed securely.  The [employer] and [employer] has [employer] on it.  Please place in mailbox as soon as possible.  Your physician should have your test results approximately 7 days after the monitor has been  mailed back to [facility].   Call [facility] Customer Care at [phone] if you have questions regarding your ZIO XT patch monitor.  Call them immediately if you see an orange light blinking on your monitor.   If your monitor falls off in less than 4 days contact our Monitor department at [phone].  If your monitor becomes loose or falls off after 4 days call [employer] at [phone] for suggestions on securing your monitor.

## 2019-12-28 NOTE — Addendum Note (Signed)
Addended by: Theresia Majors on: 12/28/2019 09:39 AM   Modules accepted: Orders

## 2019-12-28 NOTE — Progress Notes (Addendum)
Cardiology Office Note:    Date:  12/28/2019   ID:  Keli Buehner, DOB 01/18/1956, MRN [id number]  PCP:  Melida Quitter, MD  Cardiologist:  Armanda Magic, MD    Referring MD: Wynn Banker, MD   Chief Complaint  Patient presents with  . Follow-up    Noncompaction CM, PVCs    History of Present Illness:    Emma Kim is a 64 y.o. 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  She is here today for followup and is doing well.  She denies any chest pain or pressure, SOB, DOE, PND, orthopnea, LE edema, dizziness, palpitations or syncope. She is compliant with her meds and is tolerating meds with no SE.    Past Medical History:  Diagnosis Date  . Abnormal EKG   . Asymptomatic PVCs   . Complication of anesthesia    when she woke up, "felt like she was coming through a fog"  . Distal radius fracture 06/13/2014  . 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 Mayford Knife  . PAC (premature atrial contraction)   . Vitamin D deficiency     Past Surgical History:  Procedure Laterality Date  . COLONOSCOPY    . OPEN REDUCTION INTERNAL FIXATION (ORIF) DISTAL RADIAL FRACTURE Right 06/13/2014   Procedure: OPEN REDUCTION INTERNAL FIXATION (ORIF) RIGHT DISTAL RADIAL FRACTURE;  Surgeon: Sharma Covert, MD;  Location: [facility] OR;  Service: Orthopedics;  Laterality: Right;  . TUBAL LIGATION      Current Medications: Current Meds  Medication Sig  . ascorbic acid (VITAMIN C) 1000 MG tablet Take by mouth.  . cholecalciferol (VITAMIN D) 1000 UNITS tablet Take 1,000 Units by mouth daily.  . ferrous sulfate 325 (65 FE) MG tablet Take 325 mg by mouth daily with breakfast.  . fluticasone (FLONASE) 50 MCG/ACT nasal spray Place 1 spray into both nostrils daily.  . [DISCONTINUED] Ascorbic Acid (VITAMIN C PO) Take 1-2 tablets by mouth daily.     Allergies:   Bee pollen   Social History   Socioeconomic  History  . Marital status: Married    Spouse name: Not on file  . Number of children: Not on file  . Years of education: Not on file  . Highest education level: Not on file  Occupational History  . Not on file  Tobacco Use  . Smoking status: Never Smoker  . Smokeless tobacco: Never Used  Substance and Sexual Activity  . Alcohol use: Yes    Alcohol/week: 7.0 standard drinks    Types: 7 Glasses of wine per week    Comment: 1 glass of wine nightly or less  . Drug use: No  . Sexual activity: Not on file  Other Topics Concern  . 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 Difficulty of Paying Living Expenses:   Food Insecurity:   . Worried About Programme researcher, broadcasting/film/video in the Last Year:   . Barista in the Last Year:   Transportation Needs:   . Freight forwarder (Medical):   Marland Kitchen  Lack of Transportation (Non-Medical):   Physical Activity:   . Days of Exercise per Week:   . Minutes of Exercise per Session:   Stress:   . Feeling of Stress :   Social Connections:   . Frequency of Communication with Friends and Family:   . Frequency of Social Gatherings with Friends and Family:   . Attends Religious Services:   . Active Member of Clubs or Organizations:   . Attends Banker Meetings:   Marland Kitchen Marital Status:      Family History: The patient's family history includes Alcoholism in her maternal grandfather and maternal grandmother; Deep vein thrombosis in her brother; Dementia in her mother; Hyperlipidemia in her father and mother; Hypertension in her brother and mother.  ROS:   Please see the history of present illness.    ROS  All other systems reviewed and negative.   EKGs/Labs/Other Studies Reviewed:    The  following studies were reviewed today: none  EKG:  EKG is  ordered today and showed sinus bradycardia at 57bpm with nonspecific T wave abnormality  Recent Labs: 02/13/2019: ALT 10; BUN 16; Creatinine, Ser 0.76; Potassium 3.6; Sodium 143   Recent Lipid Panel    Component Value Date/Time   CHOL 197 02/13/2019 0828   TRIG 82.0 02/13/2019 0828   HDL 69.80 02/13/2019 0828   CHOLHDL 3 02/13/2019 0828   VLDL 16.4 02/13/2019 0828   LDLCALC 111 (H) 02/13/2019 0828    Physical Exam:    VS:  BP (!) 144/86   Pulse (!) 57   Ht 5\' 2"  (1.575 m)   Wt 133 lb 3.2 oz (60.4 kg)   SpO2 98%   BMI 24.36 kg/m     Wt Readings from Last 3 Encounters:  12/28/19 133 lb 3.2 oz (60.4 kg)  06/16/19 134 lb 4.8 oz (60.9 kg)  05/24/18 136 lb 6.4 oz (61.9 kg)     GEN: Well nourished, well developed in no acute distress HEENT: Normal NECK: No JVD; No carotid bruits LYMPHATICS: No lymphadenopathy CARDIAC:RRR, no murmurs, rubs, gallops RESPIRATORY:  Clear to auscultation without rales, wheezing or rhonchi  ABDOMEN: Soft, non-tender, non-distended MUSCULOSKELETAL:  No edema; No deformity  SKIN: Warm and dry NEUROLOGIC:  Alert and oriented x 3 PSYCHIATRIC:  Normal affect    ASSESSMENT:    1. Noncompaction cardiomyopathy (HCC)   2. Asymptomatic PVCs   3. PAC (premature atrial contraction)    PLAN:    In order of problems listed above:  1.  Noncompaction Cardiomyopathy  -She is doing well.   -2D echo 05/2018 showed normal LVF with no evidence of non-compaction -24 hour Holter showed occasional PVCs and PACs -repeat 2 day Ziopatch to assess for ventricular arrhythmias  2.  Asymptomatic PVCs  - she remains asymptomatic with no palpitations.   3.  PAC's  - she denies any palpitations.   Medication Adjustments/Labs and Tests Ordered: Current medicines are reviewed at length with the patient today.  Concerns regarding medicines are outlined above.  Orders Placed This Encounter  Procedures  .  EKG 12-Lead   No orders of the defined types were placed in this encounter.   Signed, 06/2018, MD  12/28/2019 9:32 AM    [facility]

## 2020-01-24 ENCOUNTER — Ambulatory Visit (INDEPENDENT_AMBULATORY_CARE_PROVIDER_SITE_OTHER): Payer: BC Managed Care – PPO

## 2020-01-24 DIAGNOSIS — I493 Ventricular premature depolarization: Secondary | ICD-10-CM

## 2020-01-24 DIAGNOSIS — I491 Atrial premature depolarization: Secondary | ICD-10-CM

## 2020-01-24 DIAGNOSIS — I428 Other cardiomyopathies: Secondary | ICD-10-CM | POA: Diagnosis not present

## 2020-01-29 DIAGNOSIS — Z01818 Encounter for other preprocedural examination: Secondary | ICD-10-CM | POA: Diagnosis not present

## 2020-01-31 DIAGNOSIS — I428 Other cardiomyopathies: Secondary | ICD-10-CM | POA: Diagnosis not present

## 2020-01-31 DIAGNOSIS — I493 Ventricular premature depolarization: Secondary | ICD-10-CM | POA: Diagnosis not present

## 2020-01-31 DIAGNOSIS — I491 Atrial premature depolarization: Secondary | ICD-10-CM | POA: Diagnosis not present

## 2020-02-14 DIAGNOSIS — Z1159 Encounter for screening for other viral diseases: Secondary | ICD-10-CM | POA: Diagnosis not present

## 2020-02-19 DIAGNOSIS — Z1211 Encounter for screening for malignant neoplasm of colon: Secondary | ICD-10-CM | POA: Diagnosis not present

## 2020-04-22 DIAGNOSIS — H5213 Myopia, bilateral: Secondary | ICD-10-CM | POA: Diagnosis not present

## 2020-04-26 ENCOUNTER — Other Ambulatory Visit: Payer: Self-pay

## 2020-04-26 ENCOUNTER — Ambulatory Visit
Admission: RE | Admit: 2020-04-26 | Discharge: 2020-04-26 | Disposition: A | Discharge status: Home / Self Care | Payer: BC Managed Care – PPO | Source: Ambulatory Visit | Attending: Internal Medicine | Admitting: Internal Medicine

## 2020-04-26 DIAGNOSIS — Z1231 Encounter for screening mammogram for malignant neoplasm of breast: Secondary | ICD-10-CM

## 2020-04-26 DIAGNOSIS — M85852 Other specified disorders of bone density and structure, left thigh: Secondary | ICD-10-CM | POA: Diagnosis not present

## 2020-04-26 DIAGNOSIS — Z13820 Encounter for screening for osteoporosis: Secondary | ICD-10-CM

## 2020-04-26 DIAGNOSIS — Z780 Asymptomatic menopausal state: Secondary | ICD-10-CM | POA: Diagnosis not present

## 2020-05-28 DIAGNOSIS — Z0000 Encounter for general adult medical examination without abnormal findings: Secondary | ICD-10-CM | POA: Diagnosis not present

## 2020-05-28 DIAGNOSIS — Z13820 Encounter for screening for osteoporosis: Secondary | ICD-10-CM | POA: Diagnosis not present

## 2020-06-06 DIAGNOSIS — Z0000 Encounter for general adult medical examination without abnormal findings: Secondary | ICD-10-CM | POA: Diagnosis not present

## 2020-06-06 DIAGNOSIS — Z1331 Encounter for screening for depression: Secondary | ICD-10-CM | POA: Diagnosis not present

## 2020-06-06 DIAGNOSIS — I428 Other cardiomyopathies: Secondary | ICD-10-CM | POA: Diagnosis not present

## 2020-06-11 DIAGNOSIS — E785 Hyperlipidemia, unspecified: Secondary | ICD-10-CM | POA: Diagnosis not present

## 2020-06-11 DIAGNOSIS — R739 Hyperglycemia, unspecified: Secondary | ICD-10-CM | POA: Diagnosis not present

## 2020-07-15 DIAGNOSIS — L814 Other melanin hyperpigmentation: Secondary | ICD-10-CM | POA: Diagnosis not present

## 2020-07-15 DIAGNOSIS — L821 Other seborrheic keratosis: Secondary | ICD-10-CM | POA: Diagnosis not present

## 2020-07-15 DIAGNOSIS — D225 Melanocytic nevi of trunk: Secondary | ICD-10-CM | POA: Diagnosis not present

## 2020-07-15 DIAGNOSIS — Z86018 Personal history of other benign neoplasm: Secondary | ICD-10-CM | POA: Diagnosis not present

## 2021-01-27 DIAGNOSIS — T23171A Burn of first degree of right wrist, initial encounter: Secondary | ICD-10-CM | POA: Diagnosis not present

## 2021-01-27 DIAGNOSIS — L723 Sebaceous cyst: Secondary | ICD-10-CM | POA: Diagnosis not present

## 2021-03-01 IMAGING — MG DIGITAL SCREENING BILAT W/ TOMO W/ CAD
6 of 10 series · 6 of 30 positions shown · non-contrast
Comparison: Previous exam(s).

CLINICAL DATA: Screening.

EXAM:
DIGITAL SCREENING BILATERAL MAMMOGRAM WITH TOMO AND CAD

[R MLO synth-2D]
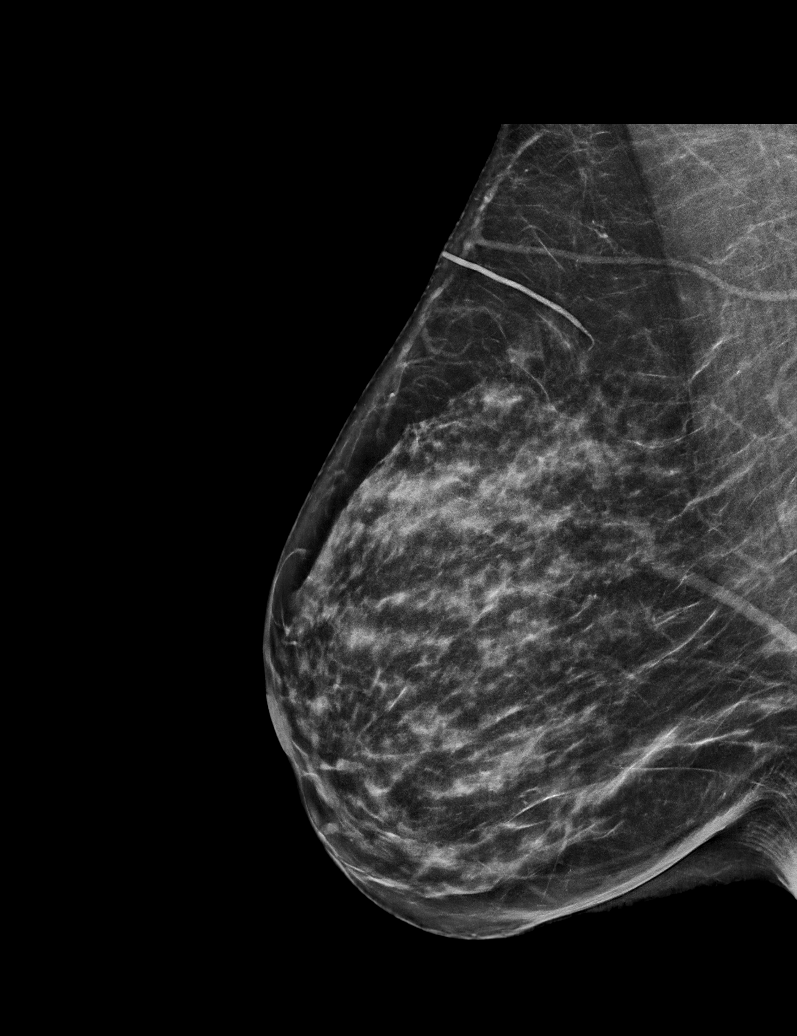

[L MLO synth-2D (1 of 2)]
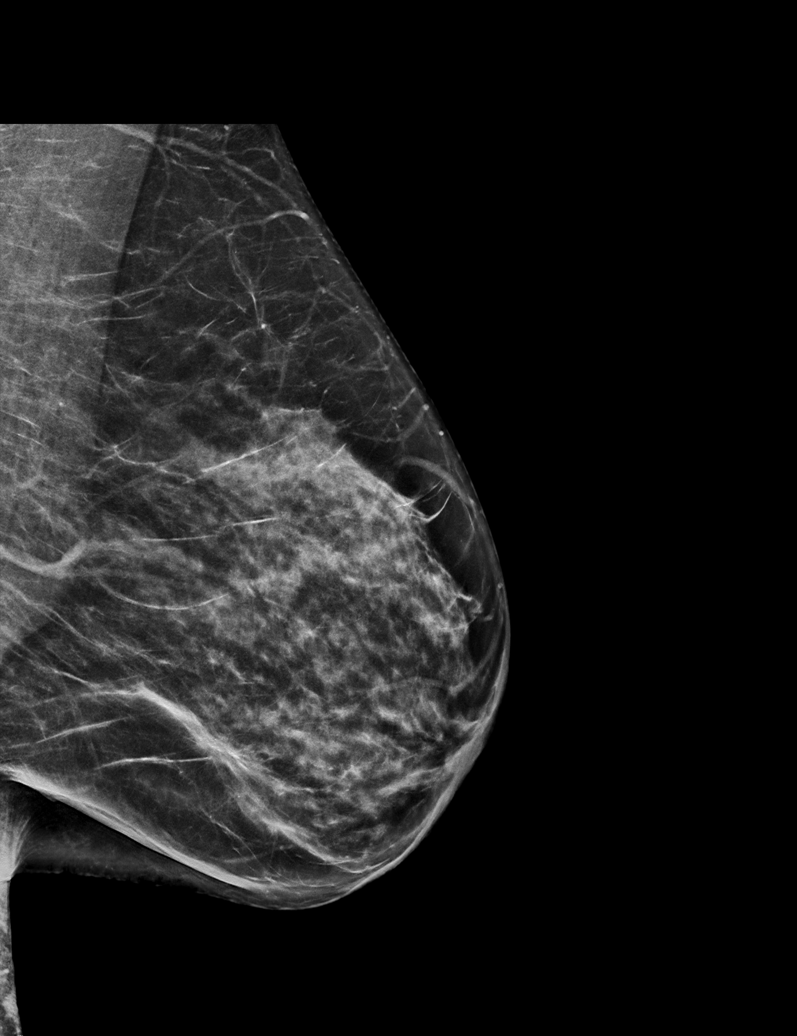

[L MLO synth-2D (2 of 2)]
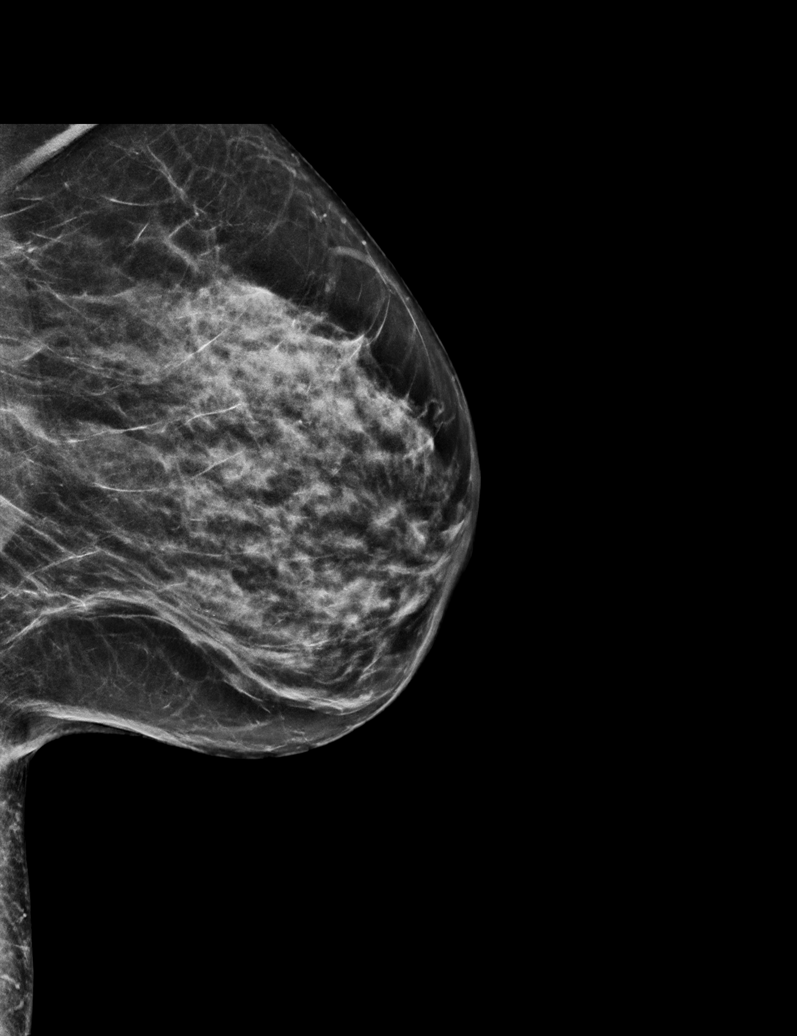

[R CC synth-2D]
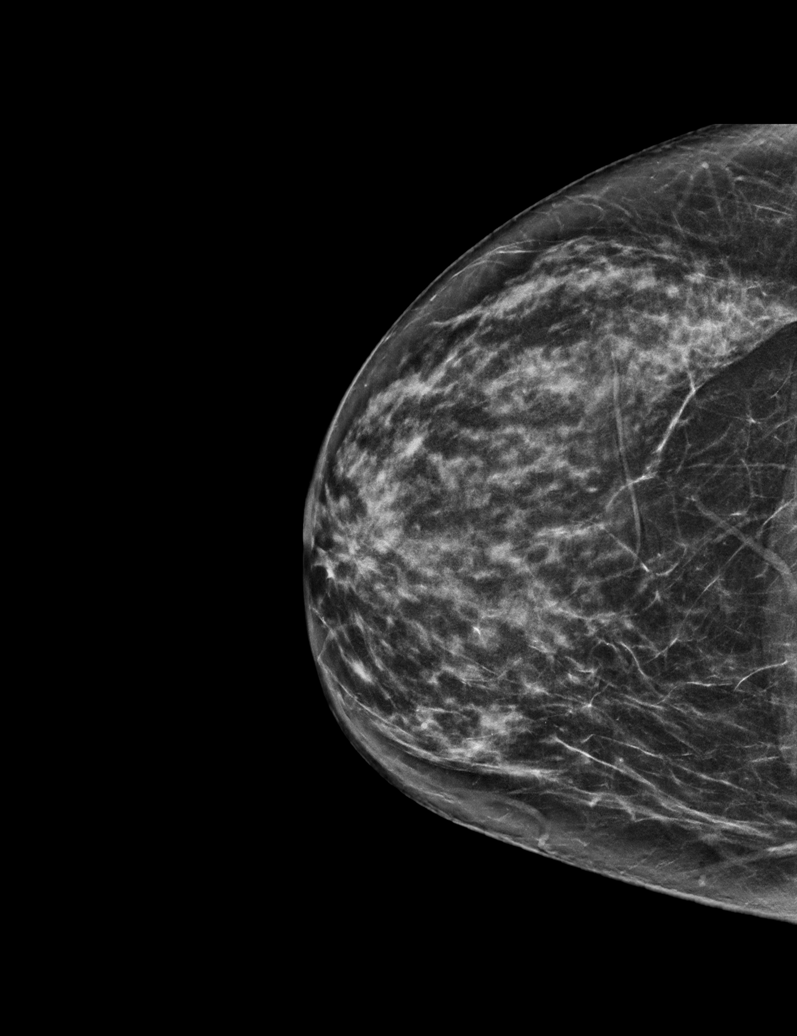

[L CC synth-2D]
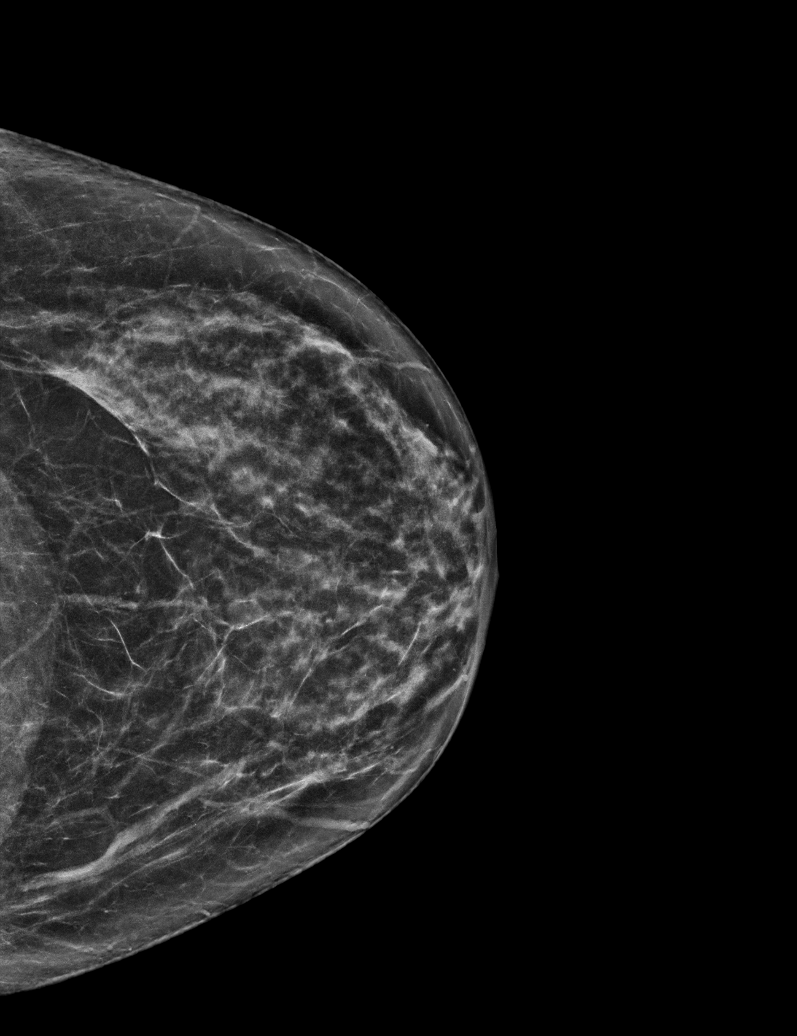

[R CC tomo · tomo slice 30/59.0]
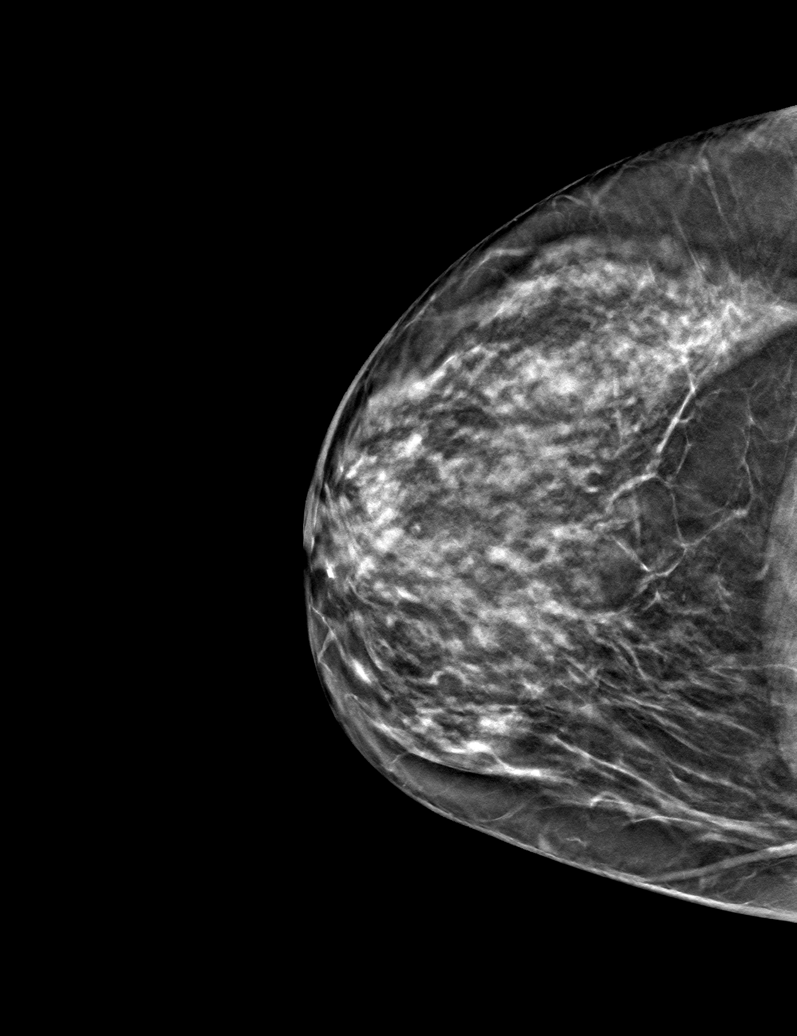

[6 of 30 positions shown; findings below may reference images not displayed]

ACR Breast Density Category c: The breast tissue is heterogeneously
dense, which may obscure small masses.
FINDINGS: There are no findings suspicious for malignancy. Images were
processed with CAD.
IMPRESSION: No mammographic evidence of malignancy. A result letter of this
screening mammogram will be mailed directly to the patient.

RECOMMENDATION:
Screening mammogram in one year. (Code:[id number])

BI-RADS CATEGORY  1: Negative.

## 2021-03-17 ENCOUNTER — Other Ambulatory Visit: Payer: Self-pay | Admitting: *Deleted

## 2021-03-17 ENCOUNTER — Other Ambulatory Visit: Payer: Self-pay | Admitting: Internal Medicine

## 2021-03-17 DIAGNOSIS — Z1231 Encounter for screening mammogram for malignant neoplasm of breast: Secondary | ICD-10-CM

## 2021-05-05 ENCOUNTER — Ambulatory Visit
Admission: RE | Admit: 2021-05-05 | Discharge: 2021-05-05 | Disposition: A | Discharge status: Home / Self Care | Payer: BC Managed Care – PPO | Source: Ambulatory Visit | Attending: Internal Medicine | Admitting: Internal Medicine

## 2021-05-05 DIAGNOSIS — H2513 Age-related nuclear cataract, bilateral: Secondary | ICD-10-CM | POA: Diagnosis not present

## 2021-05-05 DIAGNOSIS — Z1231 Encounter for screening mammogram for malignant neoplasm of breast: Secondary | ICD-10-CM

## 2021-06-12 ENCOUNTER — Ambulatory Visit: Payer: BC Managed Care – PPO | Admitting: Cardiology

## 2021-07-31 DIAGNOSIS — R7989 Other specified abnormal findings of blood chemistry: Secondary | ICD-10-CM | POA: Diagnosis not present

## 2021-07-31 DIAGNOSIS — E785 Hyperlipidemia, unspecified: Secondary | ICD-10-CM | POA: Diagnosis not present

## 2021-07-31 DIAGNOSIS — R739 Hyperglycemia, unspecified: Secondary | ICD-10-CM | POA: Diagnosis not present

## 2021-08-07 DIAGNOSIS — Z0000 Encounter for general adult medical examination without abnormal findings: Secondary | ICD-10-CM | POA: Diagnosis not present

## 2021-08-07 DIAGNOSIS — Z1331 Encounter for screening for depression: Secondary | ICD-10-CM | POA: Diagnosis not present

## 2021-08-28 ENCOUNTER — Ambulatory Visit: Payer: Self-pay | Admitting: Cardiology

## 2021-11-07 ENCOUNTER — Ambulatory Visit (INDEPENDENT_AMBULATORY_CARE_PROVIDER_SITE_OTHER): Payer: BC Managed Care – PPO | Admitting: Nurse Practitioner

## 2021-11-07 ENCOUNTER — Encounter: Payer: Self-pay | Admitting: Nurse Practitioner

## 2021-11-07 VITALS — BP 128/74 | HR 66 | Ht 62.0 in | Wt 128.6 lb

## 2021-11-07 DIAGNOSIS — I491 Atrial premature depolarization: Secondary | ICD-10-CM | POA: Diagnosis not present

## 2021-11-07 DIAGNOSIS — I493 Ventricular premature depolarization: Secondary | ICD-10-CM | POA: Diagnosis not present

## 2021-11-07 DIAGNOSIS — I428 Other cardiomyopathies: Secondary | ICD-10-CM

## 2021-12-17 DIAGNOSIS — Z7689 Persons encountering health services in other specified circumstances: Secondary | ICD-10-CM | POA: Diagnosis not present

## 2021-12-17 DIAGNOSIS — I5189 Other ill-defined heart diseases: Secondary | ICD-10-CM | POA: Diagnosis not present

## 2021-12-17 DIAGNOSIS — I10 Essential (primary) hypertension: Secondary | ICD-10-CM | POA: Diagnosis not present

## 2021-12-17 DIAGNOSIS — J302 Other seasonal allergic rhinitis: Secondary | ICD-10-CM | POA: Diagnosis not present

## 2021-12-22 DIAGNOSIS — I5189 Other ill-defined heart diseases: Secondary | ICD-10-CM | POA: Diagnosis not present
